# Patient Record
Sex: Female | Born: 1950 | Race: White | Hispanic: No | Marital: Single
Health system: Southern US, Community
[De-identification: ages and names within clinical notes are randomized; demographics above are authoritative.]

---

## 2020-12-13 ENCOUNTER — Emergency Department (HOSPITAL_COMMUNITY): Payer: 59

## 2020-12-13 ENCOUNTER — Other Ambulatory Visit: Payer: Self-pay

## 2020-12-13 ENCOUNTER — Inpatient Hospital Stay (HOSPITAL_COMMUNITY): Payer: 59

## 2020-12-13 ENCOUNTER — Observation Stay (HOSPITAL_COMMUNITY)
Admission: EM | Admit: 2020-12-13 | Discharge: 2020-12-15 | Disposition: A | Discharge status: Home / Self Care | Payer: 59 | Attending: Internal Medicine | Admitting: Internal Medicine

## 2020-12-13 DIAGNOSIS — J45909 Unspecified asthma, uncomplicated: Secondary | ICD-10-CM | POA: Diagnosis not present

## 2020-12-13 DIAGNOSIS — Z79899 Other long term (current) drug therapy: Secondary | ICD-10-CM | POA: Diagnosis not present

## 2020-12-13 DIAGNOSIS — F0390 Unspecified dementia without behavioral disturbance: Secondary | ICD-10-CM | POA: Insufficient documentation

## 2020-12-13 DIAGNOSIS — R4182 Altered mental status, unspecified: Secondary | ICD-10-CM | POA: Diagnosis not present

## 2020-12-13 DIAGNOSIS — I10 Essential (primary) hypertension: Secondary | ICD-10-CM | POA: Insufficient documentation

## 2020-12-13 DIAGNOSIS — Z20822 Contact with and (suspected) exposure to covid-19: Secondary | ICD-10-CM | POA: Insufficient documentation

## 2020-12-13 DIAGNOSIS — G459 Transient cerebral ischemic attack, unspecified: Principal | ICD-10-CM | POA: Diagnosis present

## 2020-12-13 DIAGNOSIS — R531 Weakness: Secondary | ICD-10-CM | POA: Diagnosis present

## 2020-12-13 DIAGNOSIS — R29898 Other symptoms and signs involving the musculoskeletal system: Secondary | ICD-10-CM

## 2020-12-13 LAB — COMPREHENSIVE METABOLIC PANEL
ALT: 29 U/L (ref 0–44)
AST: 26 U/L (ref 15–41)
Albumin: 3.6 g/dL (ref 3.5–5.0)
Alkaline Phosphatase: 98 U/L (ref 38–126)
Anion gap: 8 (ref 5–15)
BUN: 12 mg/dL (ref 8–23)
CO2: 29 mmol/L (ref 22–32)
Calcium: 9.6 mg/dL (ref 8.9–10.3)
Chloride: 102 mmol/L (ref 98–111)
Creatinine, Ser: 0.62 mg/dL (ref 0.44–1.00)
GFR, Estimated: >60 mL/min (ref >60)
Glucose, Bld: 105 mg/dL — ABNORMAL HIGH (ref 70–99)
Potassium: 3.9 mmol/L (ref 3.5–5.1)
Sodium: 139 mmol/L (ref 135–145)
Total Bilirubin: 0.7 mg/dL (ref 0.3–1.2)
Total Protein: 6.9 g/dL (ref 6.5–8.1)

## 2020-12-13 LAB — URINALYSIS, ROUTINE W REFLEX MICROSCOPIC
Bilirubin Urine: NEGATIVE
Glucose, UA: NEGATIVE mg/dL
Ketones, ur: NEGATIVE mg/dL
Leukocytes,Ua: NEGATIVE
Nitrite: POSITIVE — AB
Protein, ur: NEGATIVE mg/dL
Specific Gravity, Urine: <1.005 — ABNORMAL LOW (ref 1.005–1.030)
pH: 7 (ref 5.0–8.0)

## 2020-12-13 LAB — URINALYSIS, MICROSCOPIC (REFLEX): Squamous Epithelial / HPF: NONE SEEN (ref 0–5)

## 2020-12-13 LAB — TSH: TSH: 2.498 u[IU]/mL (ref 0.350–4.500)

## 2020-12-13 LAB — DIFFERENTIAL
Abs Immature Granulocytes: 0.02 10*3/uL (ref 0.00–0.07)
Basophils Absolute: 0 10*3/uL (ref 0.0–0.1)
Basophils Relative: 0 %
Eosinophils Absolute: 0.2 10*3/uL (ref 0.0–0.5)
Eosinophils Relative: 4 %
Immature Granulocytes: 0 %
Lymphocytes Relative: 14 %
Lymphs Abs: 0.9 10*3/uL (ref 0.7–4.0)
Monocytes Absolute: 0.6 10*3/uL (ref 0.1–1.0)
Monocytes Relative: 9 %
Neutro Abs: 4.6 10*3/uL (ref 1.7–7.7)
Neutrophils Relative %: 73 %

## 2020-12-13 LAB — I-STAT CHEM 8, ED
BUN: 13 mg/dL (ref 8–23)
Calcium, Ion: 1.19 mmol/L (ref 1.15–1.40)
Chloride: 104 mmol/L (ref 98–111)
Creatinine, Ser: 0.6 mg/dL (ref 0.44–1.00)
Glucose, Bld: 102 mg/dL — ABNORMAL HIGH (ref 70–99)
HCT: 36 % (ref 36.0–46.0)
Hemoglobin: 12.2 g/dL (ref 12.0–15.0)
Potassium: 4 mmol/L (ref 3.5–5.1)
Sodium: 141 mmol/L (ref 135–145)
TCO2: 30 mmol/L (ref 22–32)

## 2020-12-13 LAB — LIPID PANEL
Cholesterol: 180 mg/dL (ref 0–200)
HDL: 69 mg/dL (ref >40)
LDL Cholesterol: 104 mg/dL — ABNORMAL HIGH (ref 0–99)
Total CHOL/HDL Ratio: 2.6 RATIO
Triglycerides: 34 mg/dL (ref <150)
VLDL: 7 mg/dL (ref 0–40)

## 2020-12-13 LAB — CBC
HCT: 39.9 % (ref 36.0–46.0)
Hemoglobin: 12.7 g/dL (ref 12.0–15.0)
MCH: 31.5 pg (ref 26.0–34.0)
MCHC: 31.8 g/dL (ref 30.0–36.0)
MCV: 99 fL (ref 80.0–100.0)
Platelets: 243 10*3/uL (ref 150–400)
RBC: 4.03 MIL/uL (ref 3.87–5.11)
RDW: 12.2 % (ref 11.5–15.5)
WBC: 6.3 10*3/uL (ref 4.0–10.5)
nRBC: 0 % (ref 0.0–0.2)

## 2020-12-13 LAB — PROTIME-INR
INR: 1 (ref 0.8–1.2)
Prothrombin Time: 13.1 seconds (ref 11.4–15.2)

## 2020-12-13 LAB — RESP PANEL BY RT-PCR (FLU A&B, COVID) ARPGX2
Influenza A by PCR: NEGATIVE
Influenza B by PCR: NEGATIVE
SARS Coronavirus 2 by RT PCR: NEGATIVE

## 2020-12-13 LAB — PHOSPHORUS: Phosphorus: 3.8 mg/dL (ref 2.5–4.6)

## 2020-12-13 LAB — CBG MONITORING, ED: Glucose-Capillary: 93 mg/dL (ref 70–99)

## 2020-12-13 LAB — MAGNESIUM: Magnesium: 2.1 mg/dL (ref 1.7–2.4)

## 2020-12-13 LAB — APTT: aPTT: 22 seconds — ABNORMAL LOW (ref 24–36)

## 2020-12-13 IMAGING — CT CT HEAD CODE STROKE
4 series · 16 of 47 positions shown, 18 images · non-contrast
Comparison: No pertinent prior exams available for comparison.

CLINICAL DATA: Code stroke. Neuro deficit, acute, stroke suspected.
Additional history provided: Left-sided facial droop, left-sided
weakness.

EXAM:
CT HEAD WITHOUT CONTRAST
TECHNIQUE: Contiguous axial images were obtained from the base of the skull
through the vertex without intravenous contrast.

[Series 3: head wo · axial · 0.43mm/px · z∈[+68,+188]mm · 7 of 33 slices shown, 9 images]
[im 5/33  brain]
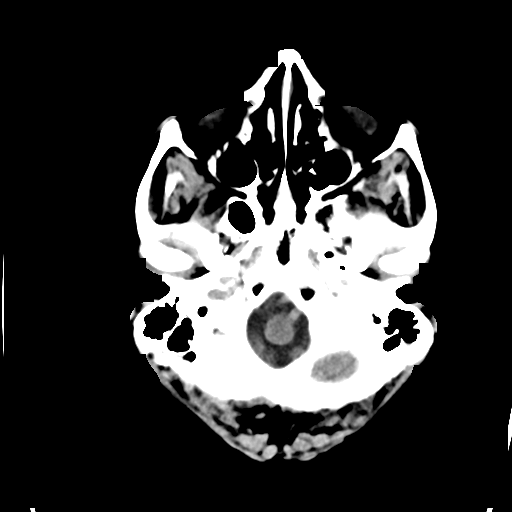
[im 5/33  bone]
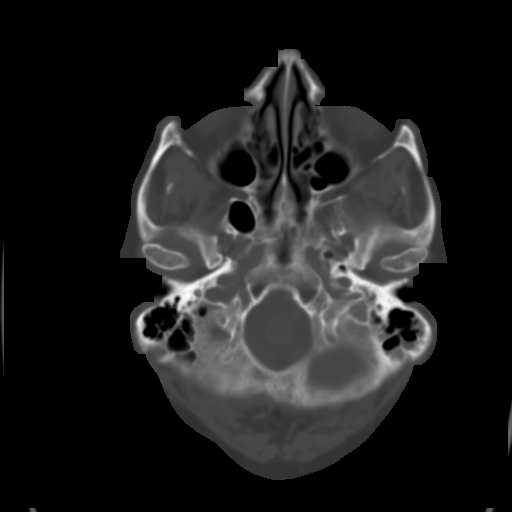
[im 9/33  brain]
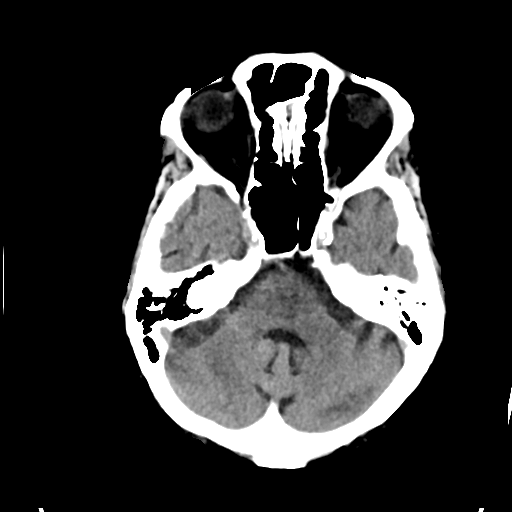
[im 13/33  brain]
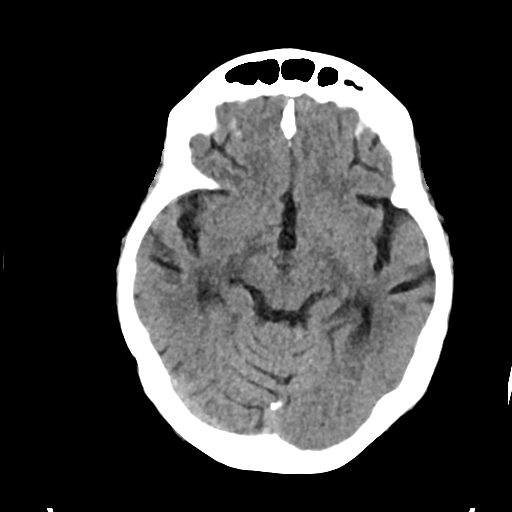
[im 17/33  brain]
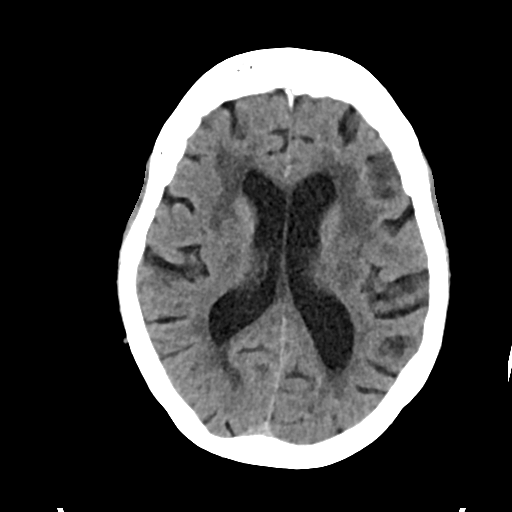
[im 21/33  brain]
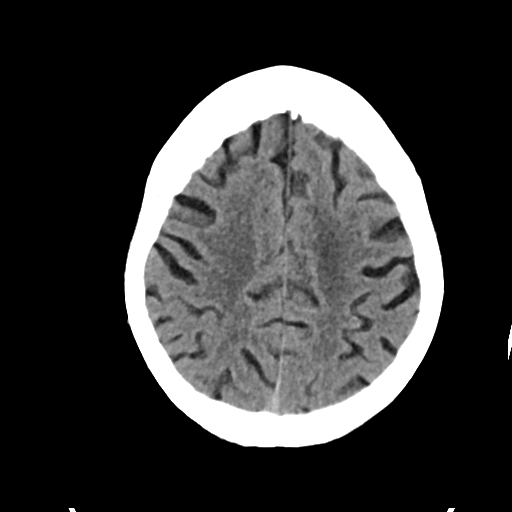
[im 21/33  bone]
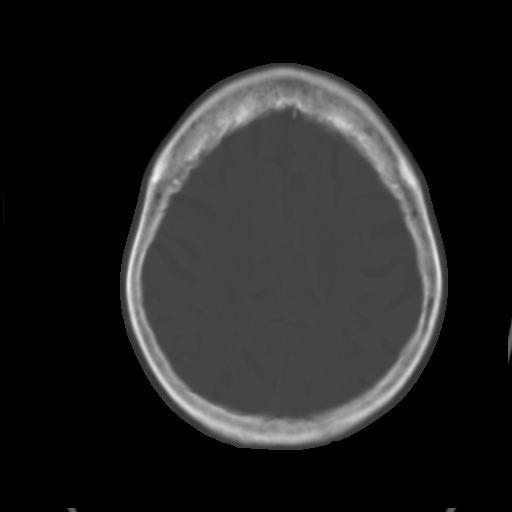
[im 25/33  brain]
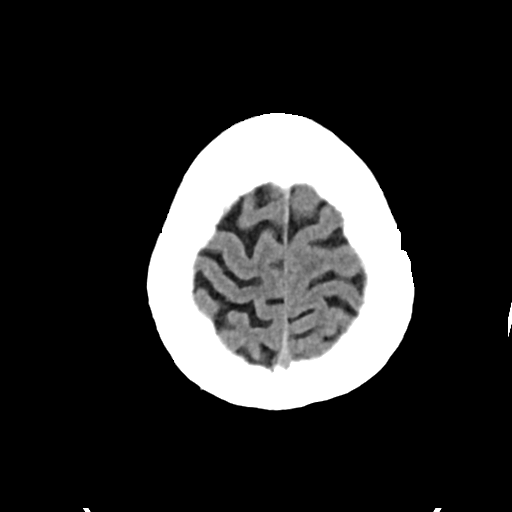
[im 29/33  brain]
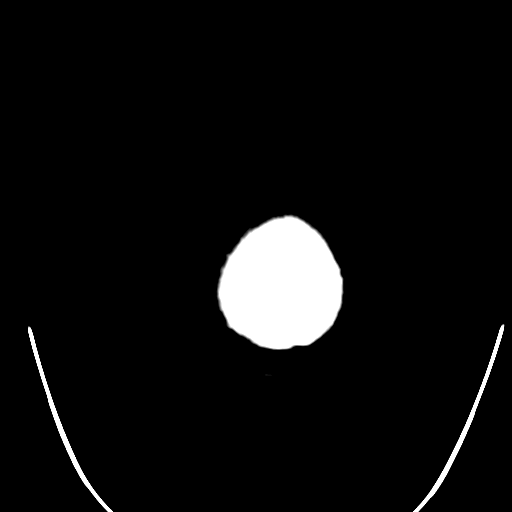

[Series 4: head bone · axial · 0.43mm/px · z∈[+64,+96]mm · 3 of 81 slices shown]
[im 9/81  bone]
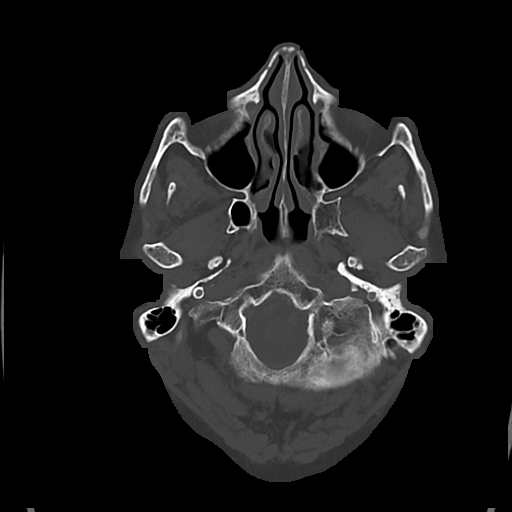
[im 17/81  bone]
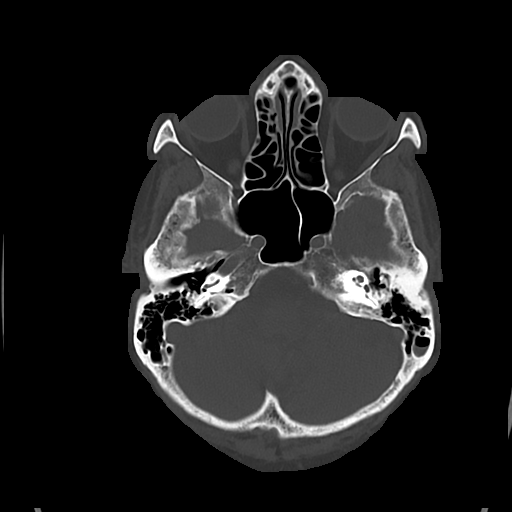
[im 25/81  bone]
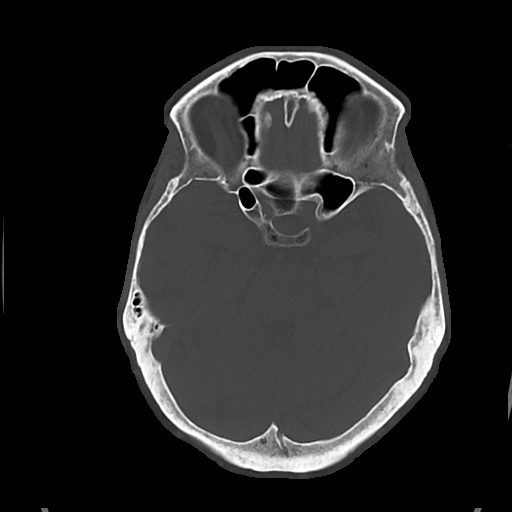

[Series 5: cor soft · coronal · 0.30mm/px · 3 of 70 slices shown]
[im 24/70  brain]
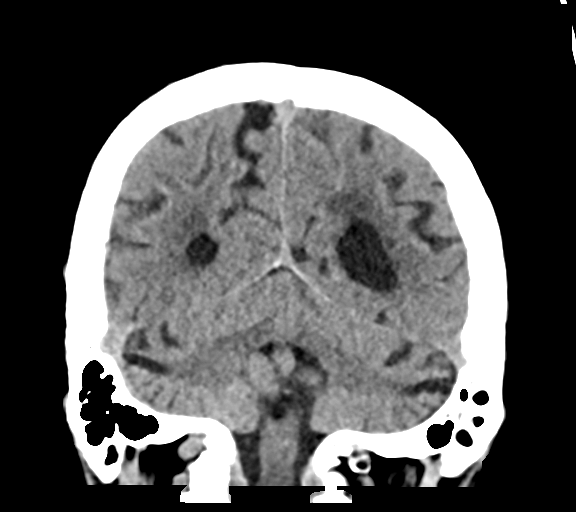
[im 31/70  brain]
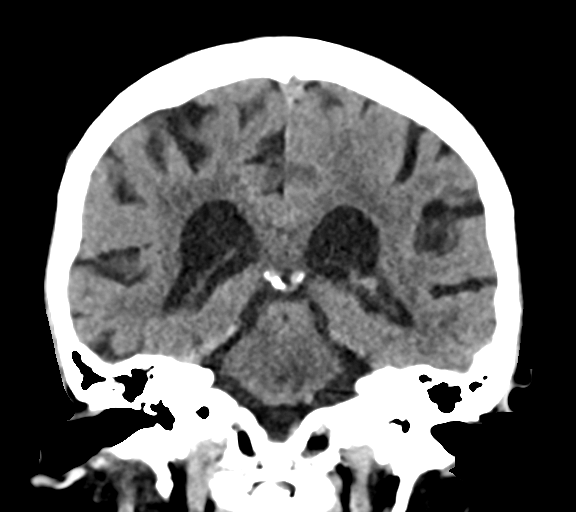
[im 39/70  brain]
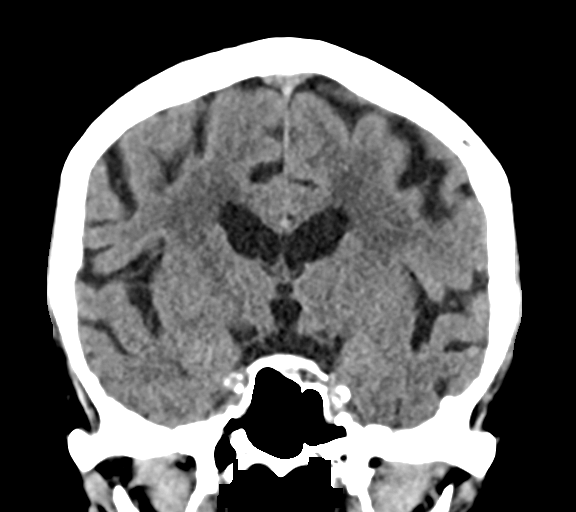

[Series 6: sag soft · sagittal · 0.32mm/px · 3 of 56 slices shown]
[im 19/56  brain]
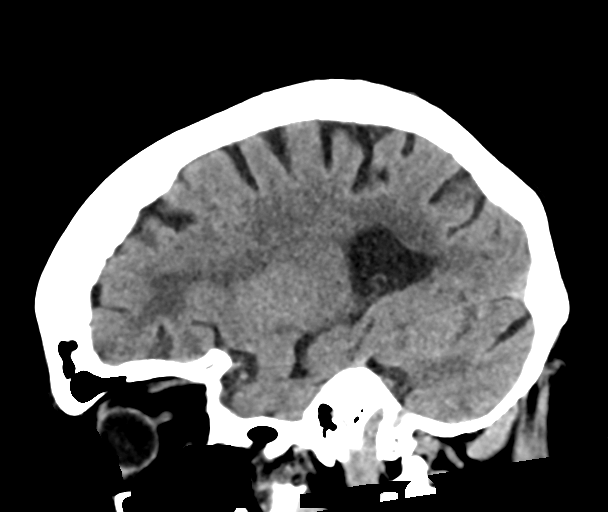
[im 28/56  brain]
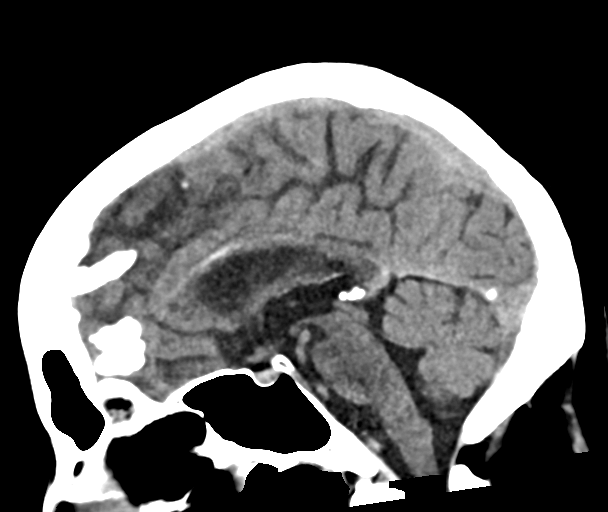
[im 37/56  brain]
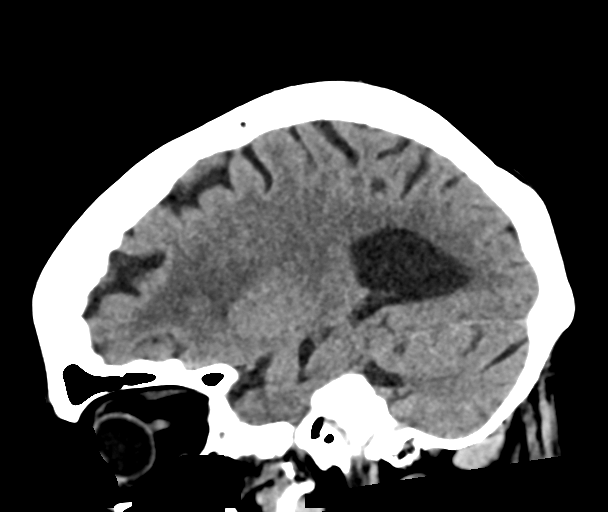

[16 of 47 positions shown; findings below may reference images not displayed]

FINDINGS: Brain:

Moderate cerebral and cerebellar atrophy.

Moderate/advanced patchy and confluent hypoattenuation within the
cerebral white matter is nonspecific, but compatible with chronic
small vessel ischemic disease.

Chronic appearing lacunar infarct within the right cerebellar
hemisphere.

There is no acute intracranial hemorrhage.

No demarcated cortical infarct.

No extra-axial fluid collection.

No evidence of intracranial mass.

No midline shift.

Vascular: No hyperdense vessel.  Atherosclerotic calcifications.

Skull: Normal. Negative for fracture or focal lesion.

Sinuses/Orbits: Visualized orbits show no acute finding. No
significant paranasal sinus disease at the imaged levels.

Other: Left mastoid effusion

ASPECTS (Alberta Stroke Program Early CT Score)

- Ganglionic level infarction (caudate, lentiform nuclei, internal
capsule, insula, M1-M3 cortex): 7

- Supraganglionic infarction (M4-M6 cortex): 3

Total score (0-10 with 10 being normal): 10

These results were communicated to Dr. FAWADULLAH at [DATE] pmon
[DATE]by text page via the [employer] messaging system.
IMPRESSION: No evidence of acute intracranial abnormality.  ASPECTS is 10.

Moderate generalized parenchymal atrophy.

Moderate/advanced cerebral white matter chronic small vessel
ischemic disease.

Chronic appearing lacunar infarct within the right cerebellar
hemisphere.

Left mastoid effusion.

## 2020-12-13 IMAGING — MR MR MRA HEAD W/O CM
10 of 12 series · 32 of 48 positions shown · non-contrast
Comparison: No pertinent prior exam.
COMPARISON: No pertinent prior exam.

CLINICAL DATA: Stroke follow-up.  Lethargy

EXAM:
MRI HEAD WITHOUT CONTRAST
MRA HEAD WITHOUT CONTRAST
TECHNIQUE: Multiplanar, multi-echo pulse sequences of the brain and surrounding
structures were acquired without intravenous contrast. Angiographic
images of the Circle of Willis were acquired using MRA technique
without intravenous contrast.

[Series 5: DWI · axial · 3.0mm · 0.88mm/px · z∈[-92,+44]mm · 7 of 96 slices shown (1 of 4)]
[im 1/96]
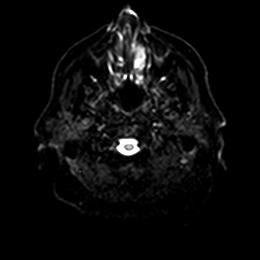
[im 16/96]
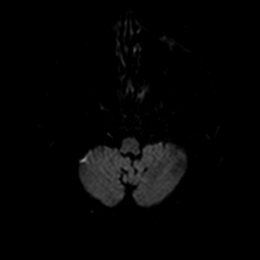
[im 32/96]
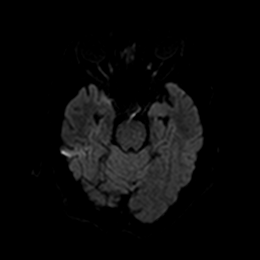
[im 48/96]
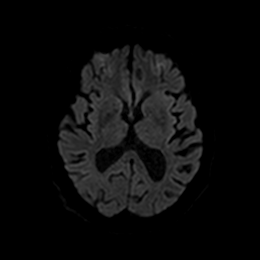
[im 64/96]
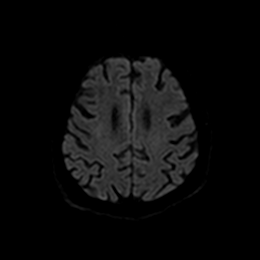
[im 80/96]
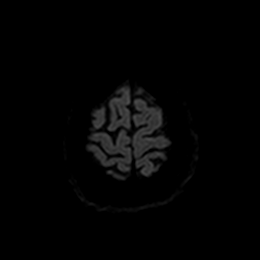
[im 96/96]
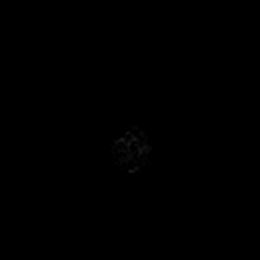

[Series 6: DWI · axial · 3.0mm · 0.88mm/px · z∈[-92,+44]mm · 3 of 48 slices shown (2 of 4)]
[im 1/48]
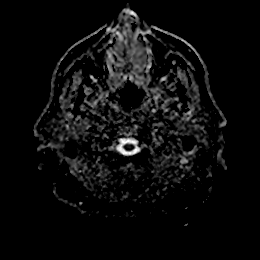
[im 24/48]
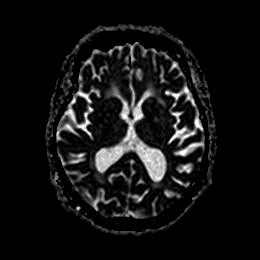
[im 48/48]
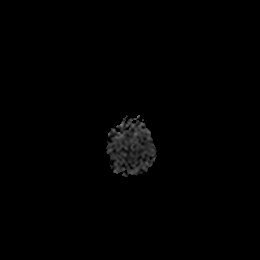

[Series 7: DWI · coronal · 4.0mm · 0.88mm/px · 4 of 64 slices shown (3 of 4)]
[im 1/64]
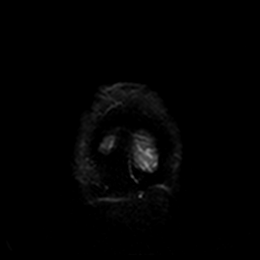
[im 22/64]
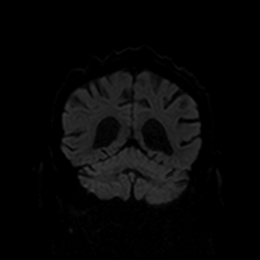
[im 43/64]
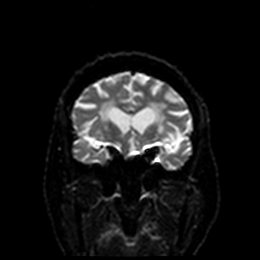
[im 64/64]
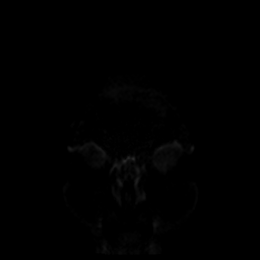

[Series 8: DWI · coronal · 4.0mm · 0.88mm/px · 2 of 32 slices shown (4 of 4)]
[im 1/32]
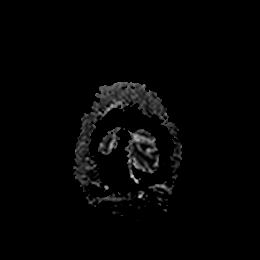
[im 32/32]
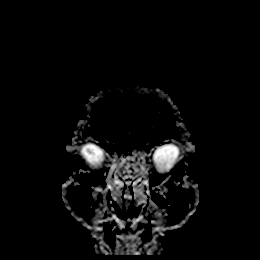

[Series 13: T1 · sagittal · 5.0mm · 0.75mm/px · 2 of 23 slices shown]
[im 1/23]
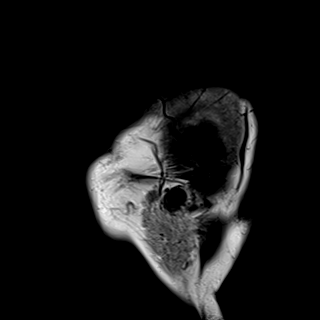
[im 23/23]
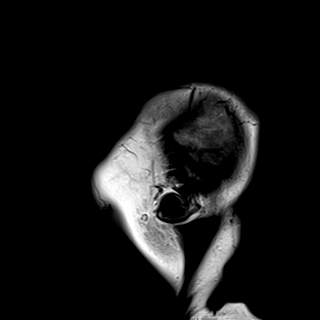

[Series 14: T2 · axial · 5.0mm · 0.72mm/px · z∈[-92,+46]mm · 2 of 25 slices shown (1 of 2)]
[im 1/25]
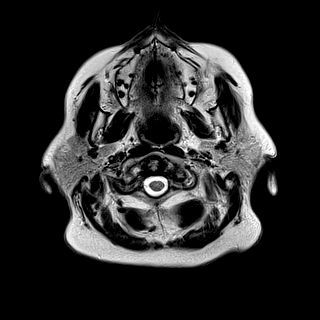
[im 25/25]
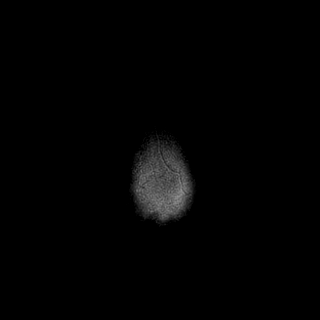

[Series 15: FLAIR · axial · 5.0mm · 0.45mm/px · z∈[-91,+47]mm · 2 of 25 slices shown]
[im 1/25]
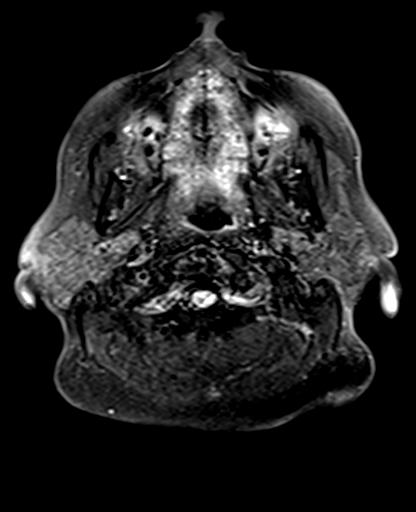
[im 25/25]
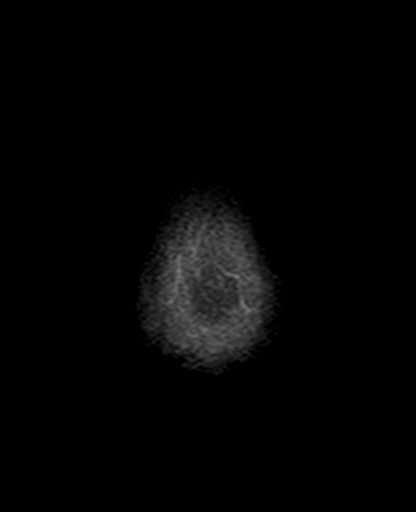

[Series 17: pha_images · axial · 3.0mm · 0.90mm/px · z∈[-90,+70]mm · 4 of 56 slices shown]
[im 1/56]
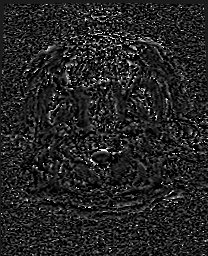
[im 19/56]
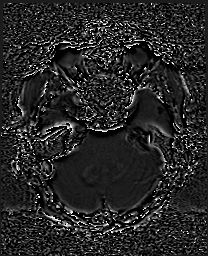
[im 37/56]
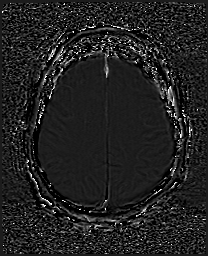
[im 56/56]
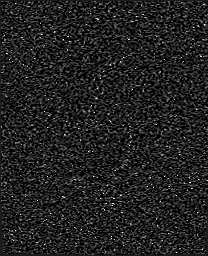

[Series 18: swi_images · axial · 3.0mm · 0.90mm/px · z∈[-96,+70]mm · 4 of 60 slices shown]
[im 1/60]
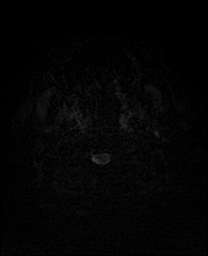
[im 20/60]
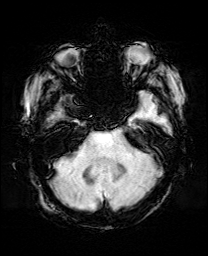
[im 40/60]
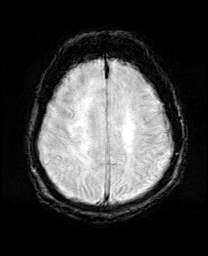
[im 60/60]
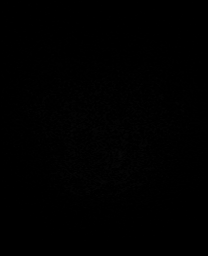

[Series 21: T2 · coronal · 5.0mm · 0.34mm/px · 2 of 29 slices shown (2 of 2)]
[im 1/29]
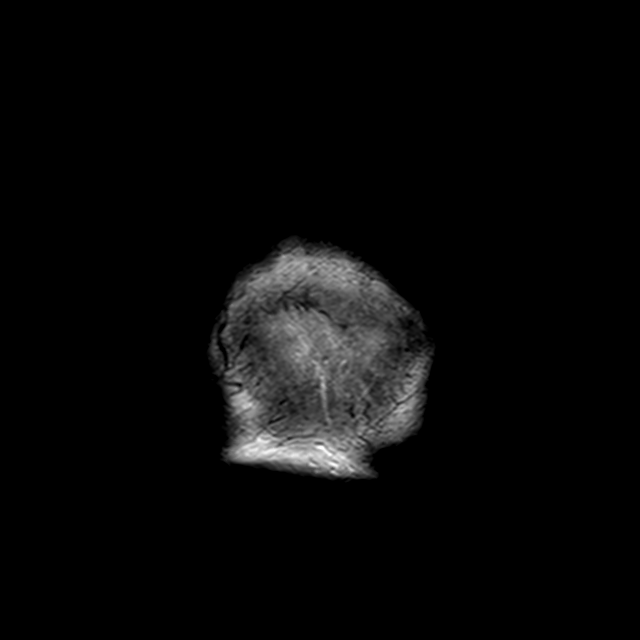
[im 29/29]
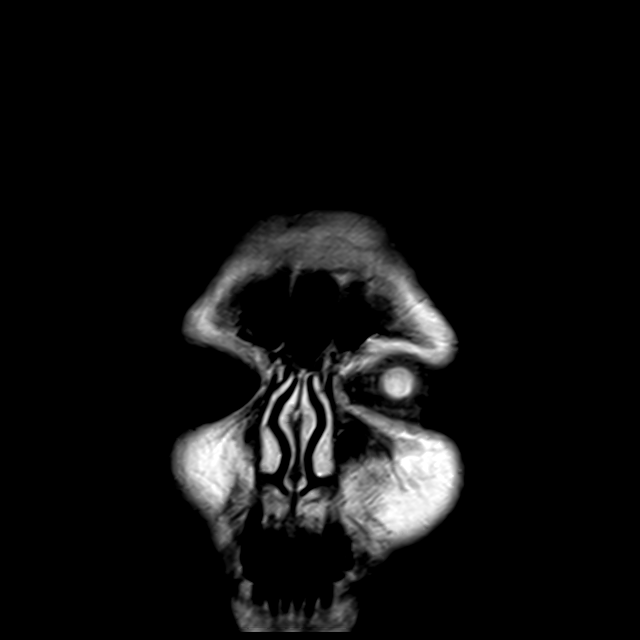

[32 of 48 positions shown; findings below may reference images not displayed]

FINDINGS: MRI HEAD FINDINGS

Brain: No acute infarct, mass effect or extra-axial collection. No
acute or chronic hemorrhage. Hyperintense T2-weighted signal is
moderately widespread throughout the white matter. Generalized
volume loss without a clear lobar predilection. The midline
structures are normal.

Vascular: Major flow voids are preserved.

Skull and upper cervical spine: Normal calvarium and skull base.
Visualized upper cervical spine and soft tissues are normal.

Sinuses/Orbits:No paranasal sinus fluid levels or advanced mucosal
thickening. No mastoid or middle ear effusion. Normal orbits.

MRA HEAD FINDINGS

POSTERIOR CIRCULATION:

--Vertebral arteries: Normal

--Inferior cerebellar arteries: Normal.

--Basilar artery: Normal.

--Superior cerebellar arteries: Normal.

--Posterior cerebral arteries: Normal.

ANTERIOR CIRCULATION:

--Intracranial internal carotid arteries: Normal.

--Anterior cerebral arteries (ACA): Normal.

--Middle cerebral arteries (MCA): Normal.

ANATOMIC VARIANTS: None
IMPRESSION: 1. No acute intracranial abnormality.
2. Moderate chronic small vessel ischemic changes.
3. Normal intracranial MRA.

## 2020-12-13 IMAGING — MR MR HEAD W/O CM
10 of 12 series · 32 of 48 positions shown · non-contrast
Comparison: No pertinent prior exam.
COMPARISON: No pertinent prior exam.

CLINICAL DATA: Stroke follow-up.  Lethargy

EXAM:
MRI HEAD WITHOUT CONTRAST
MRA HEAD WITHOUT CONTRAST
TECHNIQUE: Multiplanar, multi-echo pulse sequences of the brain and surrounding
structures were acquired without intravenous contrast. Angiographic
images of the Circle of Willis were acquired using MRA technique
without intravenous contrast.

[Series 5: DWI · axial · 3.0mm · 0.88mm/px · z∈[-92,+44]mm · 7 of 96 slices shown (1 of 4)]
[im 1/96]
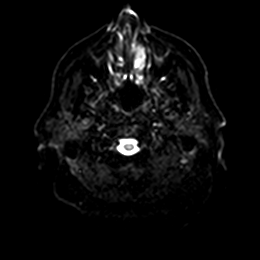
[im 16/96]
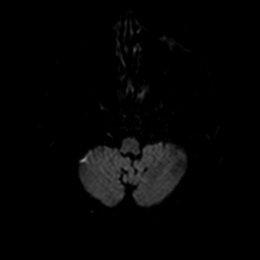
[im 32/96]
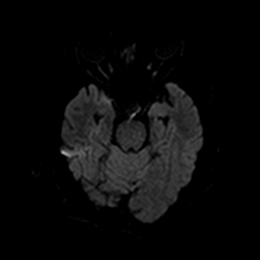
[im 48/96]
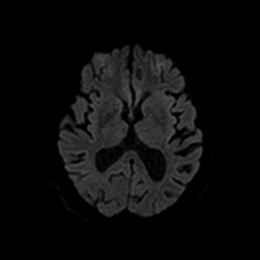
[im 64/96]
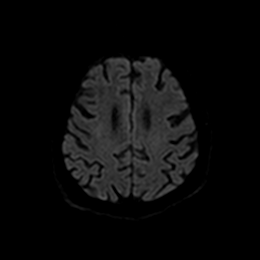
[im 80/96]
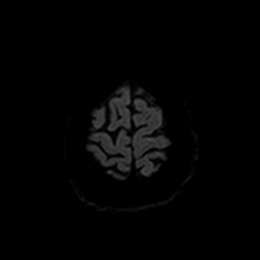
[im 96/96]
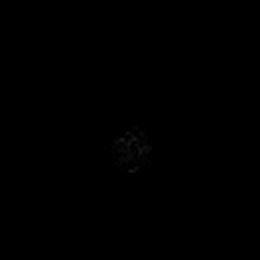

[Series 6: DWI · axial · 3.0mm · 0.88mm/px · z∈[-92,+44]mm · 3 of 48 slices shown (2 of 4)]
[im 1/48]
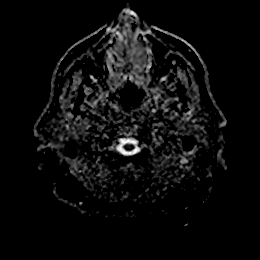
[im 24/48]
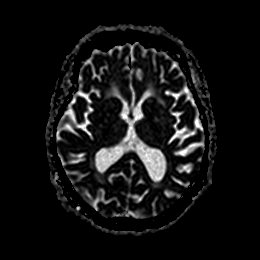
[im 48/48]
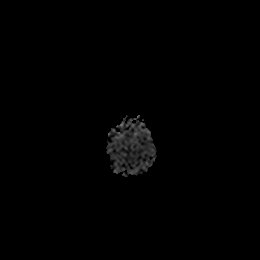

[Series 7: DWI · coronal · 4.0mm · 0.88mm/px · 4 of 64 slices shown (3 of 4)]
[im 1/64]
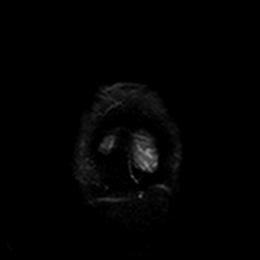
[im 22/64]
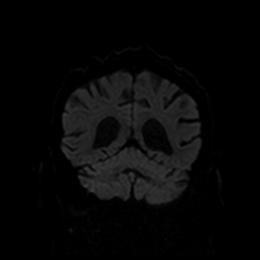
[im 43/64]
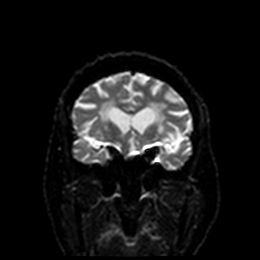
[im 64/64]
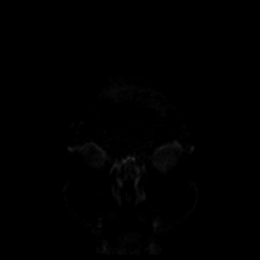

[Series 8: DWI · coronal · 4.0mm · 0.88mm/px · 2 of 32 slices shown (4 of 4)]
[im 1/32]
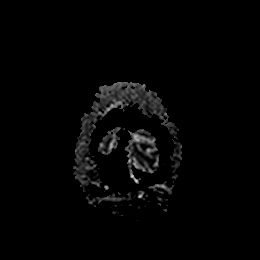
[im 32/32]
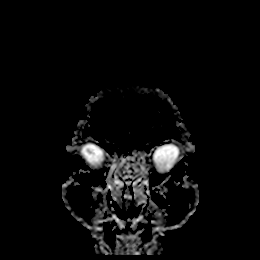

[Series 13: T1 · sagittal · 5.0mm · 0.75mm/px · 2 of 23 slices shown]
[im 1/23]
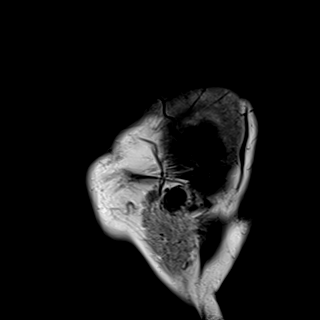
[im 23/23]
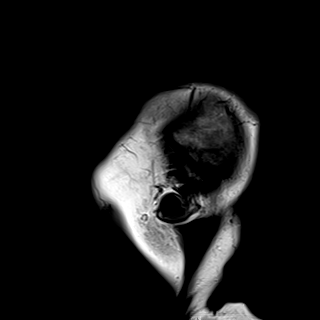

[Series 14: T2 · axial · 5.0mm · 0.72mm/px · z∈[-92,+46]mm · 2 of 25 slices shown (1 of 2)]
[im 1/25]
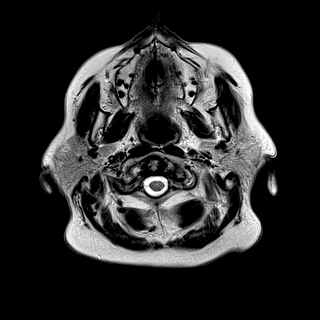
[im 25/25]
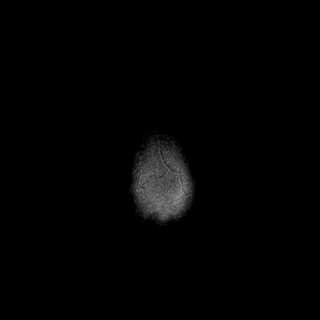

[Series 15: FLAIR · axial · 5.0mm · 0.45mm/px · z∈[-91,+47]mm · 2 of 25 slices shown]
[im 1/25]
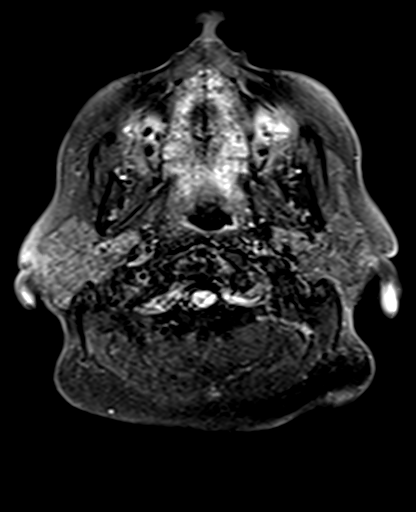
[im 25/25]
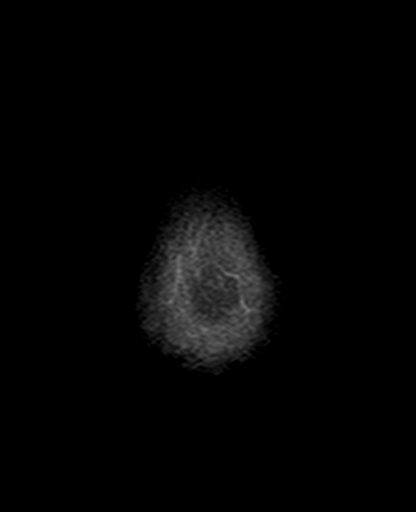

[Series 17: pha_images · axial · 3.0mm · 0.90mm/px · z∈[-90,+70]mm · 4 of 56 slices shown]
[im 1/56]
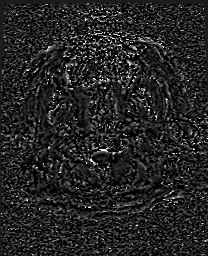
[im 19/56]
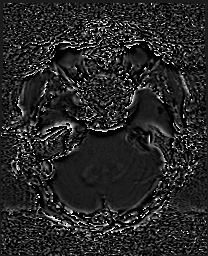
[im 37/56]
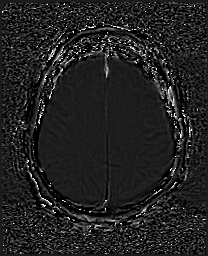
[im 56/56]
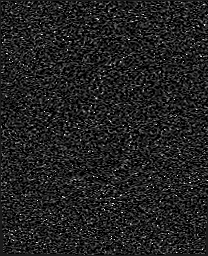

[Series 18: swi_images · axial · 3.0mm · 0.90mm/px · z∈[-96,+70]mm · 4 of 60 slices shown]
[im 1/60]
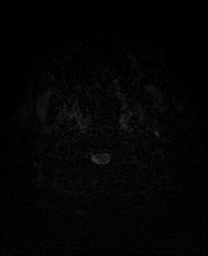
[im 20/60]
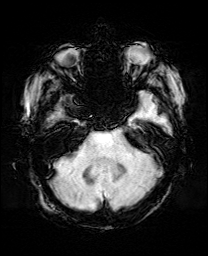
[im 40/60]
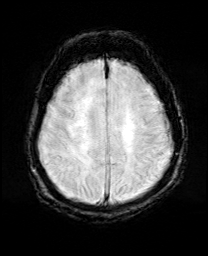
[im 60/60]
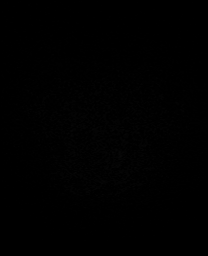

[Series 21: T2 · coronal · 5.0mm · 0.34mm/px · 2 of 29 slices shown (2 of 2)]
[im 1/29]
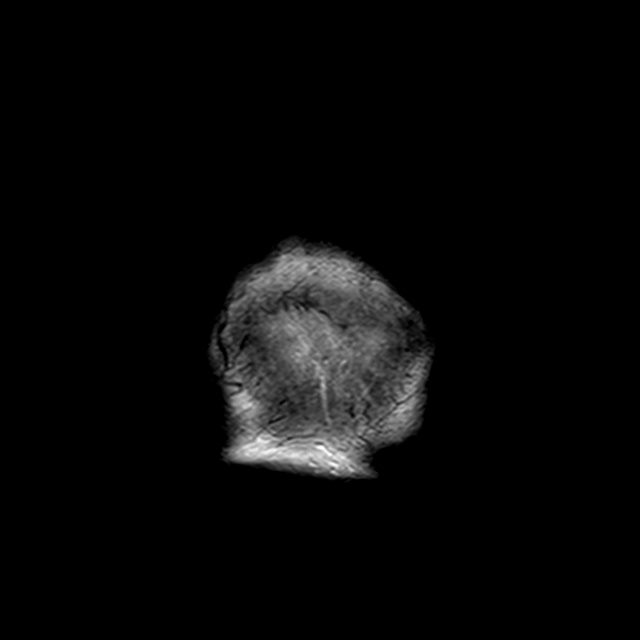
[im 29/29]
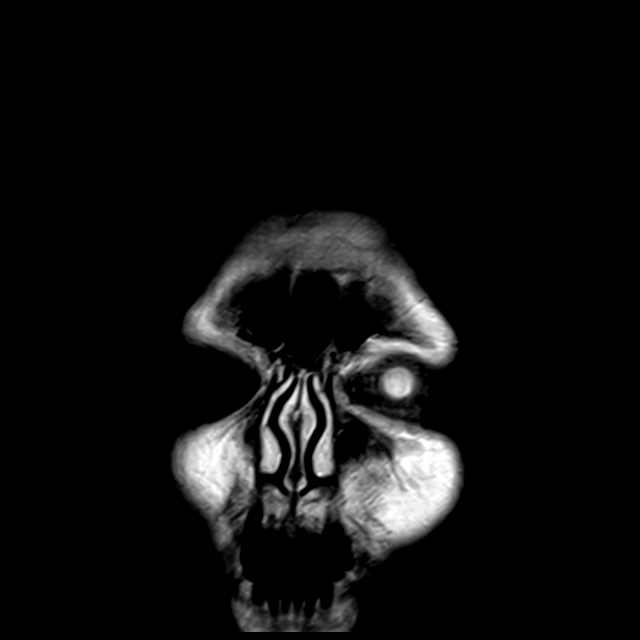

[32 of 48 positions shown; findings below may reference images not displayed]

FINDINGS: MRI HEAD FINDINGS

Brain: No acute infarct, mass effect or extra-axial collection. No
acute or chronic hemorrhage. Hyperintense T2-weighted signal is
moderately widespread throughout the white matter. Generalized
volume loss without a clear lobar predilection. The midline
structures are normal.

Vascular: Major flow voids are preserved.

Skull and upper cervical spine: Normal calvarium and skull base.
Visualized upper cervical spine and soft tissues are normal.

Sinuses/Orbits:No paranasal sinus fluid levels or advanced mucosal
thickening. No mastoid or middle ear effusion. Normal orbits.

MRA HEAD FINDINGS

POSTERIOR CIRCULATION:

--Vertebral arteries: Normal

--Inferior cerebellar arteries: Normal.

--Basilar artery: Normal.

--Superior cerebellar arteries: Normal.

--Posterior cerebral arteries: Normal.

ANTERIOR CIRCULATION:

--Intracranial internal carotid arteries: Normal.

--Anterior cerebral arteries (ACA): Normal.

--Middle cerebral arteries (MCA): Normal.

ANATOMIC VARIANTS: None
IMPRESSION: 1. No acute intracranial abnormality.
2. Moderate chronic small vessel ischemic changes.
3. Normal intracranial MRA.

## 2020-12-13 IMAGING — DX DG CHEST 1V PORT
1 series · 1 of 1 positions shown · non-contrast
Comparison: None.

CLINICAL DATA: Weakness

EXAM:
PORTABLE CHEST 1 VIEW

[chest]
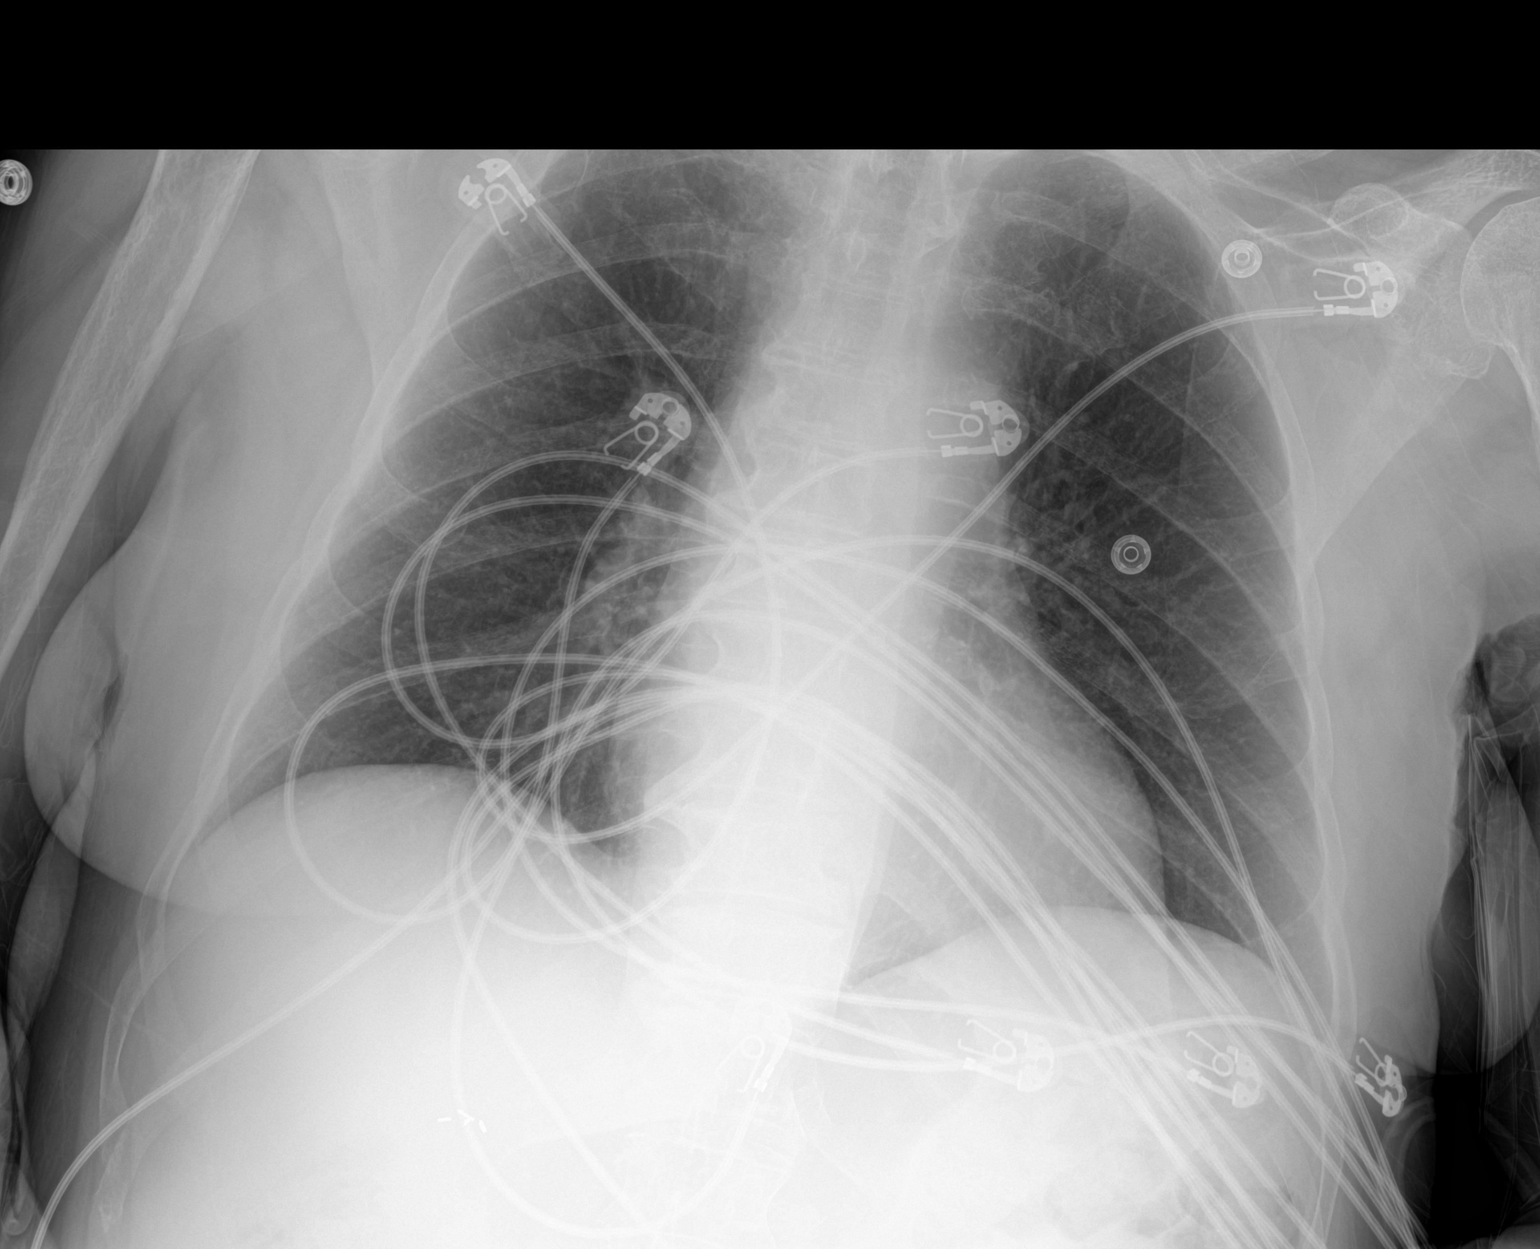

[1 of 1 positions shown; findings below may reference images not displayed]

FINDINGS: The heart size and mediastinal contours are within normal limits.
Both lungs are clear. The visualized skeletal structures are
unremarkable.
IMPRESSION: No active disease.

## 2020-12-13 MED ORDER — ENOXAPARIN SODIUM 40 MG/0.4ML IJ SOSY
40.0000 mg | PREFILLED_SYRINGE | INTRAMUSCULAR | Status: DC
  Administered 2020-12-13 – 2020-12-14 (×2): 40 mg via SUBCUTANEOUS
  Filled 2020-12-13 (×2): qty 0.4

## 2020-12-13 MED ORDER — SODIUM CHLORIDE 0.9% FLUSH
3.0000 mL | Freq: Two times a day (BID) | INTRAVENOUS | Status: DC
  Administered 2020-12-13 – 2020-12-14 (×2): 3 mL via INTRAVENOUS

## 2020-12-13 MED ORDER — ACETAMINOPHEN 325 MG PO TABS: 650.0000 mg | ORAL_TABLET | Freq: Four times a day (QID) | ORAL | Status: DC | PRN

## 2020-12-13 MED ORDER — CITALOPRAM HYDROBROMIDE 10 MG PO TABS
20.0000 mg | ORAL_TABLET | Freq: Every day | ORAL | Status: DC
  Administered 2020-12-14 – 2020-12-15 (×2): 20 mg via ORAL
  Filled 2020-12-13 (×2): qty 2

## 2020-12-13 MED ORDER — RISPERIDONE 0.5 MG PO TABS
2.0000 mg | ORAL_TABLET | Freq: Every day | ORAL | Status: DC
  Administered 2020-12-14: 2 mg via ORAL
  Filled 2020-12-13: qty 1
  Filled 2020-12-13: qty 4

## 2020-12-13 MED ORDER — ACETAMINOPHEN 650 MG RE SUPP: 650.0000 mg | Freq: Four times a day (QID) | RECTAL | Status: DC | PRN

## 2020-12-13 MED ORDER — SODIUM CHLORIDE 0.9% FLUSH
3.0000 mL | Freq: Once | INTRAVENOUS | Status: AC
  Administered 2020-12-13: 3 mL via INTRAVENOUS

## 2020-12-13 MED ORDER — ASPIRIN EC 81 MG PO TBEC
81.0000 mg | DELAYED_RELEASE_TABLET | Freq: Every day | ORAL | Status: DC
  Administered 2020-12-13 – 2020-12-15 (×3): 81 mg via ORAL
  Filled 2020-12-13 (×3): qty 1

## 2020-12-13 NOTE — ED Triage Notes (Signed)
Pt BIB [facility] from [facility] with stroke like symptoms. Staff called EMS because patient has been groggy, more lethargic then usual, being unable to get out of bed which is unlike of her. EMS observed slurred speech, L sided arm and leg weakness upon arrival. Last known normal of 1215 p.m. Upon arrival patient very drowsy, with a slight left facial droop noted. Pt Aox3 with mild slurring of speech. Pt VSS.

## 2020-12-13 NOTE — ED Provider Notes (Signed)
[facility] EMERGENCY DEPARTMENT Provider Note   CSN: [id number] Arrival date & time: 12/13/20  1718     History Chief Complaint  Patient presents with   Code Stroke    Denise Riley is a 70 y.o. female.  HPI  Level 5 caveat secondary to dementia 70 year old female with history of dementia, paranoid personality disorder, presents today with report of code stroke.  Last known normal reported at 1215.  Staff reported that she normally can get up and get around and was not able to get up as per usual.  Reported that she had left-sided weakness and was crankier than usual. Patient presented as code stroke although no focal abnormalities and went straight to the CT scanner. No past medical history on file.  There are no problems to display for this patient.   OB History   No obstetric history on file.     No family history on file.     Home Medications Prior to Admission medications   Not on File    Allergies    Patient has no allergy information on record.  Review of Systems   Review of Systems  All other systems reviewed and are negative.  Physical Exam Updated Vital Signs BP (!) 158/70 (BP Location: Right Arm)   Pulse 80   Temp (!) 97.3 F (36.3 C) (Oral)   Resp 20   Wt 69.8 kg   SpO2 96%   Physical Exam Vitals reviewed.  Constitutional:      General: She is not in acute distress. HENT:     Head: Normocephalic.     Right Ear: External ear normal.     Left Ear: External ear normal.     Nose: Nose normal.     Mouth/Throat:     Mouth: Mucous membranes are dry.  Eyes:     Extraocular Movements: Extraocular movements intact.     Pupils: Pupils are equal, round, and reactive to light.  Cardiovascular:     Rate and Rhythm: Normal rate and regular rhythm.     Pulses: Normal pulses.  Pulmonary:     Effort: Pulmonary effort is normal.  Abdominal:     General: Abdomen is flat.     Palpations: Abdomen is soft.  Musculoskeletal:         General: Normal range of motion.     Cervical back: Normal range of motion.  Skin:    General: Skin is warm.     Capillary Refill: Capillary refill takes less than 2 seconds.  Neurological:     General: No focal deficit present.     Mental Status: She is alert and oriented to person, place, and time.     Cranial Nerves: No cranial nerve deficit.     Motor: No weakness.  Psychiatric:        Mood and Affect: Mood normal.    ED Results / Procedures / Treatments   Labs (all labs ordered are listed, but only abnormal results are displayed) Labs Reviewed  APTT - Abnormal; Notable for the following components:      Result Value   aPTT 22 (*)    All other components within normal limits  I-STAT CHEM 8, ED - Abnormal; Notable for the following components:   Glucose, Bld 102 (*)    All other components within normal limits  PROTIME-INR  CBC  DIFFERENTIAL  COMPREHENSIVE METABOLIC PANEL  CBG MONITORING, ED  CBG MONITORING, ED    EKG EKG Interpretation  Date/Time:  Sunday December 13 2020 17:52:28 EDT Ventricular Rate:  69 PR Interval:  171 QRS Duration: 101 QT Interval:  442 QTC Calculation: 474 R Axis:   0 Text Interpretation: Sinus rhythm Probable left atrial enlargement Abnormal R-wave progression, early transition Confirmed by Micharl Helmes ([id number]) on 12/13/2020 7:59:48 PM  Radiology CT HEAD CODE STROKE WO CONTRAST  Result Date: 12/13/2020 CLINICAL DATA:  Code stroke. Neuro deficit, acute, stroke suspected. Additional history provided: Left-sided facial droop, left-sided weakness. EXAM: CT HEAD WITHOUT CONTRAST TECHNIQUE: Contiguous axial images were obtained from the base of the skull through the vertex without intravenous contrast. COMPARISON:  No pertinent prior exams available for comparison. FINDINGS: Brain: Moderate cerebral and cerebellar atrophy. Moderate/advanced patchy and confluent hypoattenuation within the cerebral white matter is nonspecific, but compatible with  chronic small vessel ischemic disease. Chronic appearing lacunar infarct within the right cerebellar hemisphere. There is no acute intracranial hemorrhage. No demarcated cortical infarct. No extra-axial fluid collection. No evidence of intracranial mass. No midline shift. Vascular: No hyperdense vessel.  Atherosclerotic calcifications. Skull: Normal. Negative for fracture or focal lesion. Sinuses/Orbits: Visualized orbits show no acute finding. No significant paranasal sinus disease at the imaged levels. Other: Left mastoid effusion ASPECTS (Alberta Stroke Program Early CT Score) - Ganglionic level infarction (caudate, lentiform nuclei, internal capsule, insula, M1-M3 cortex): 7 - Supraganglionic infarction (M4-M6 cortex): 3 Total score (0-10 with 10 being normal): 10 These results were communicated to Dr. Lindzen at 5:38 pmon 6/19/2022by text page via the AMION messaging system. IMPRESSION: No evidence of acute intracranial abnormality.  ASPECTS is 10. Moderate generalized parenchymal atrophy. Moderate/advanced cerebral white matter chronic small vessel ischemic disease. Chronic appearing lacunar infarct within the right cerebellar hemisphere. Left mastoid effusion. Electronically Signed   By: Kyle  Golden DO   On: 12/13/2020 17:38    Procedures Procedures   Medications Ordered in ED Medications  sodium chloride flush (NS) 0.9 % injection 3 mL (has no administration in time range)    ED Course  I have reviewed the triage vital signs and the nursing notes.  Pertinent labs & imaging results that were available during my care of the patient were reviewed by me and considered in my medical decision making (see chart for details).    MDM Rules/Calculators/A&P                          70  year old female with report of being groggy than usual, generalized weakness and some reported left sided weakness.  Here she has not had any lateralized symptoms.  She was seen and evaluated by neurology as a code  stroke upon arrival. Seen and evaluated here with CT, labs.  She was not given tPA due to being outside of the thrombolytic time window and IR not appropriate as not consistent with LVO.  Differential diagnosis including unwitnessed seizure with postictal state, TIA, and sedation due to medications or small part identified on CT.  Advised admission for MRI, cardiac telemetry, carotid ultrasound, TTE, fasting lipids, prophylactic therapy with aspirin, PT OT speech therapy, n.p.o. until passing swallow screen Discussed with IM teaching service and plan to see for admission.  Final Clinical Impression(s) / ED Diagnoses Final diagnoses:  Left arm weakness    Rx / DC Orders ED Discharge Orders     None        , MD 12/13/20 2009

## 2020-12-13 NOTE — Consult Note (Signed)
Neurology Consultation  Reason for Consult: Left arm weakness, dysarthria  Referring Physician: Dr. Rosalia Hammers  CC: :"I couldn't get out of bed because my left arm was weak"  History is obtained from: EMS, Patient, SNF records   HPI: Denise Riley is a 70 y.o. female with a medical history significant for vascular dementia with behavior disturbance, paranoid personality disorder, frequent falls, essential hypertension, hyperlipidemia, vitamin D deficiency, anemia, and major depressive disorder who was brought in by EMS from her skilled nursing facility after she was found to be lethargic and weak on her left arm. Patient states that she noticed that she could not get out of bed due to left arm weakness at around 12:15 PM but did not note any weakness in her left lower extremity. She also complained of pain during EMS evaluation of left arm strength, possibly affecting her left arm function.   At baseline, SNF notes that Denise Riley is normally awake and communicative. She frequently paces the halls of her SNF without assistive device. She does have vascular dementia at baseline with unspecified cognitive deficits. She is dependent upon staff for her ADLs.   LKW: 12:15 pm tpa given?: no, outside of time window IR Thrombectomy? No, presentation not consistent with LVO Modified Rankin Scale: 3-Moderate disability-requires help but walks WITHOUT assistance  ROS: A complete ROS was performed and is negative except as noted in the HPI.   No past medical history on file. Vascular dementia with behavior disturbance Paranoid personality disorder Frequent falls Essential hypertension Hyperlipidemia Vitamin D deficiency Major depressive disorder  No family history on file.  Social History:   has no history on file for tobacco use, alcohol use, and drug use.  Medications Meds at SNF not currently available in Epic   Current Facility-Administered Medications:    sodium chloride flush (NS) 0.9 %  injection 3 mL, 3 mL, Intravenous, Once, Margarita Grizzle, MD No current outpatient medications on file.  Exam: Current vital signs: Wt 69.8 kg  Vital signs in last 24 hours: Weight:  [69.8 kg] 69.8 kg (06/19 1700)  GENERAL: Drowsy, laying in EMS stretcher, in no acute distress Head: Normocephalic and atraumatic EENT: Normal conjunctivae, no OP obstruction LUNGS: Normal respiratory effort. Non-labored breathing CV: RRR on tele ABDOMEN - Soft, non-tender Ext: warm, well perfused, without obvious abnormality  NEURO:  Mental Status: Drowsy but wakes easily to voice, alert, and oriented to person, place, year, month, state. She does incorrectly state the city initially. She is able to provide a clear description of present events in the context of short and poorly detailed answers to questions.  Concentration is fair - she is able to name the months of the year from January to August but then loses track. She has a subtle dysarthria and is not aphasic. Naming, repetition, fluency, and comprehension intact. No neglect is noted.  Cranial Nerves:  II: PERRL. Visual fields full.  III, IV, VI: EOMI without ptosis V: Sensation is intact to light touch and symmetrical to face.  VII: Face is subtly asymmetric with smiling with slightly less elevation of left mouth.  VIII: Hearing is intact to voice IX, X: Palate elevation is symmetric. Phonation normal.  XI: Normal sternocleidomastoid and trapezius muscle strength XII: Tongue protrudes midline without fasciculations.   Motor: 5/5 strength present in bilateral lower extremities and 4+/5 strength present in bilateral upper extremities. No vertical drift noted throughout extremities.  Tone is normal. Bulk is normal.  Sensation: Intact to light touch and cool temperature bilaterally  in all four extremities.  Coordination: FTN intact bilaterally. No pronator drift.  DTRs: 3+ and symmetric patellae, 3+ right biceps and brachioradialis, 2+ left bicep  and brachioradialis  Plantars: Right: upgoing Left: downgoing  Gait: Deferred  NIHSS: 1a Level of Conscious.: 1 1b LOC Questions: 0 1c LOC Commands: 0 2 Best Gaze: 0 3 Visual: 0 4 Facial Palsy: 1 5a Motor Arm - left: 0 5b Motor Arm - Right: 0 6a Motor Leg - Left: 0 6b Motor Leg - Right: 0 7 Limb Ataxia: 0 8 Sensory: 0 9 Best Language: 0 10 Dysarthria: 1 11 Extinct. and Inatten.: 0 TOTAL: 3  Labs I have reviewed labs in epic and the results pertinent to this consultation are: CBC    Component Value Date/Time   HGB 12.2 12/13/2020 1729   HCT 36.0 12/13/2020 1729   CMP     Component Value Date/Time   NA 141 12/13/2020 1729   K 4.0 12/13/2020 1729   CL 104 12/13/2020 1729   GLUCOSE 102 (H) 12/13/2020 1729   BUN 13 12/13/2020 1729   CREATININE 0.60 12/13/2020 1729   Lipid Panel  No results found for: CHOL, TRIG, HDL, CHOLHDL, VLDL, LDLCALC, LDLDIRECT No results found for: HGBA1C  Imaging I have reviewed the images obtained:  CT-scan of the brain 12/13/2020: No evidence of acute intracranial abnormality.  ASPECTS is 10. Moderate generalized parenchymal atrophy. Moderate/advanced cerebral white matter chronic small vessel ischemic disease. Chronic appearing lacunar infarct within the right cerebellar hemisphere. Left mastoid effusion.  Assessment: 70 y.o. female who presented as a Code Stroke for evaluation of left upper extremity weakness, lethargy, and dysarthria. - Examination revealed patient without objective left upper extremity weakness (initially thought weakness may be due to pain or discomfort). She does have minimal dysarthria in the context of her drowsiness. Initial NIHSS of 3. - CT imaging without acute intracranial abnormality but with moderate generalized parenchymal atrophy, moderate cerebral white matter chronic small vessel ischemic disease, and chronic lacunar right cerebellar infarct.  - Stroke risk factors include age, gender, hypertension,  hyperlipidemia, and previous stroke. - tPA not given due to patient out of thrombolytic time window, IR not appropriate because presentation is not consistent with LVO.  - DDx includes new onset unwitnessed seizure with post-ictal state, TIA, sedation due to medications or small infarct not identified on CT.   Recommendations: - MRI and MRA of brain without contrast - TTE - Cardiac telemetry - Carotid ultrasound - Fasting lipid panel, Hemoglobin A1c - Consider left arm imaging if continued complaints of pain - Prophylactic therapy: Antiplatelet: ASA 81 mg daily (if home medications do not include antiplatelet therapy) - PT/OT/Speech therapy - NPO until passed swallow screen - Modified permissive HTN protocoll given advanced age. Treat SBP if > 180.   Denise Riley, AGAC-Denise Riley [facility] Pager: [phone]  I have seen and examined the patient. I have formulated the assessment and recommendations. My exam findings were observed and documented by Denise Boast, Denise Riley.  Electronically signed: Dr. Caryl Riley

## 2020-12-13 NOTE — H&P (Signed)
Date: 12/13/2020               Patient Name:  Denise Riley MRN: [id number]  DOB: March 09, 1951 Age / Sex: 70 y.o., female   PCP: No primary care provider on file.         Medical Service: Internal Medicine Teaching Service         Attending Physician: Dr. Oswaldo Done, Marquita Palms, *    First Contact: Merrilyn Puma, MD Pager: [phone]  Second Contact: Verdene Lennert, MD Pager: IB [phone]       After Hours (After 5p/  First Contact Pager: [phone]  weekends / holidays): Second Contact Pager: [phone]   SUBJECTIVE   Chief Complaint: left arm weakness  History of Present Illness: Denise Riley is a 70 y.o. female with a pertinent PMH of vascular dementia, paranoid personality disorder, HTN, HLD, vitamin D deficiency, anemia, major depressive disorder, who presents to [facility] from [facility] with left arm weakness.  Patient was found earlier today with left arm weakness and increased confusion concerning for acute stroke and was sent to [facility] for further evaluation. On evaluation the patient awake and alert to person, time, and event. She states that she was back in [place], but only required minimal prompting to become oriented to place. She is slow to answer questions but able to answer question in short phrases without elaboration. She was unable to give much detail regarding the symptoms that brought her into [facility], but states that she was hear because nursing staff was concerned she was having "mini strokes". She did admit to some dizziness when standing today, but denies any dizziness currently. She denies any focal weakness, numbness/tingling, change in vision, dysarthria, facial droop, aphasia,  She endorses generalized weakness, and frequent urinary incontinence, but otherwise denies chest pain, shortness of breath, abdominal pain, dysuria, hematuria, constipation/diarrhea, recent changes in medications, sick contacts, recent falls.  Past Medical History Vascualr  dementia Paranoid mood disorder Asthma Gait issues/Repeated falls HTN HLD Vitamin D def Anemia Allergic rhinitis Tremors  MDD Generalized mucles weakness  Medications:  Risperidone  2mg  daily Rosuvastatin 10 mg daily  Propranolol 60 mg  Citlapram 20 mg  Vitamin D Zyrtec   Social:  Lives - Occupation - retired Chief Operating Officer - live at [employer] Level of function - Ambulates with walker, needs help with using the toilet, independent with eating and drinking PCP - unknown Substance use - none  Family History: Family history is unknown  Allergies: Penicillin  Erythromycin   Review of Systems: A complete ROS was negative except as per HPI.   OBJECTIVE:   Physical Exam: Blood pressure 121/86, pulse 80, temperature (!) 97.3 F (36.3 C), temperature source Rectal, resp. rate 12, weight 69.8 kg, SpO2 100 %. Physical Exam Constitutional:      General: She is not in acute distress.    Appearance: She is not ill-appearing.     Comments: Appears older than stated age  HENT:     Head: Normocephalic and atraumatic.     Mouth/Throat:     Mouth: Mucous membranes are moist.     Pharynx: Oropharynx is clear.  Eyes:     General: No visual field deficit.    Extraocular Movements: Extraocular movements intact.     Pupils: Pupils are equal, round, and reactive to light.  Cardiovascular:     Rate and Rhythm: Normal rate and regular rhythm.     Pulses: Normal pulses.     Heart sounds:  Normal heart sounds.  Pulmonary:     Effort: Pulmonary effort is normal.     Breath sounds: Normal breath sounds.  Abdominal:     General: Abdomen is flat. There is no distension.     Tenderness: There is no abdominal tenderness.  Musculoskeletal:        General: No swelling or tenderness. Normal range of motion.     Cervical back: Normal range of motion. No tenderness.  Skin:    General: Skin is warm and dry.  Neurological:     Mental Status: She is alert.     Cranial  Nerves: Cranial nerves are intact. No facial asymmetry.     Sensory: Sensation is intact.     Motor: Weakness (generalized weakness) and tremor (bilateral hands with movment) present.     Coordination: Finger-Nose-Finger Test abnormal.     Comments: Possible mild dysarthria but may be baseline Appears drowsy become alert easily with verbal stiuli    Labs: CBC    Component Value Date/Time   WBC 6.3 12/13/2020 1724   RBC 4.03 12/13/2020 1724   HGB 12.2 12/13/2020 1729   HCT 36.0 12/13/2020 1729   PLT 243 12/13/2020 1724   MCV 99.0 12/13/2020 1724   MCH 31.5 12/13/2020 1724   MCHC 31.8 12/13/2020 1724   RDW 12.2 12/13/2020 1724   LYMPHSABS 0.9 12/13/2020 1724   MONOABS 0.6 12/13/2020 1724   EOSABS 0.2 12/13/2020 1724   BASOSABS 0.0 12/13/2020 1724     CMP     Component Value Date/Time   NA 141 12/13/2020 1729   K 4.0 12/13/2020 1729   CL 104 12/13/2020 1729   CO2 29 12/13/2020 1724   GLUCOSE 102 (H) 12/13/2020 1729   BUN 13 12/13/2020 1729   CREATININE 0.60 12/13/2020 1729   CALCIUM 9.6 12/13/2020 1724   PROT 6.9 12/13/2020 1724   ALBUMIN 3.6 12/13/2020 1724   AST 26 12/13/2020 1724   ALT 29 12/13/2020 1724   ALKPHOS 98 12/13/2020 1724   BILITOT 0.7 12/13/2020 1724   GFRNONAA >60 12/13/2020 1724    Imaging: DG Chest Port 1 View The heart size and mediastinal contours are within normal limits. Both lungs are clear. The visualized skeletal structures are unremarkable. IMPRESSION: No active disease.   CT HEAD CODE STROKE WO CONTRAST No evidence of acute intracranial abnormality.  ASPECTS is 10. Moderate generalized parenchymal atrophy. Moderate/advanced cerebral white matter chronic small vessel ischemic disease. Chronic appearing lacunar infarct within the right cerebellar hemisphere. Left mastoid effusion.   EKG: personally reviewed my interpretation is sinus rhythm   ASSESSMENT & PLAN:   Active Problems:   TIA (transient ischemic attack)   Denise Riley is  a 70 y.o. with pertinent PMH of vascular dementia, paranoid personality disorder, HTN, HLD, vitamin D deficiency, anemia, major depressive disorder,  who presented with Left arm weakness and dysarthria that has since resolved and admit for transient ischemic attack on hospital day 0  #Transient Ischemic Attack: Patient presents for evaluation of left arm weakness, dysarthria, facial drop and dizziness. Patients symptoms appear to have resolved on presentation. Therefore, patient likely experienced a TIA. Initial CT head wo contrast showed moderate to advanced small vessel ischemic disease which is consistent with her history of vascular dementia. Further work up is pending to assess risk factors for further strokes in the future.  - Neurology consulted in the ED. Appreciate their assistance.  - MRI and MRA of brian pending - [place] of carotids pending  -  Echocardiogram pending - A1c and lipid panel pending - PT/OT/Speech therapy consulted  - Bedside stroke swallow screen pending - Will continue permissive HTN  with goal SBP >180 - Will get orthostatic to further evaluate dizziness if stroke work up is negative.  - Will start ASA 81 mg per Stroke team.  #HTN #HLD #Vascular Dementia: Patient has a history of HTN and HLD. She does not take any antihypertensive medications at home, but is on rosuvastatin 10 mg daily for HLD.  - Continue rosuvastatin 10 mg, will consider increasing to 20 mg   #Paranoid mood disorder #Major depressive disorder Patient takes Risperidone 2 mg daily and citalopram 20 mg daily. She does not appear to be acutely encephalopathic. Therefore, we will continue these medications.  - Will continue these medications tomorrow  #Essential Tremors: Patient has a history of tremors and is currently taking propranolol 60 mg daily.  - Will hold off on propranolol due to permissive HTN.   Diet:  NPO until passed stroke swallow > HH diet VTE: Enoxaparin IVF: None Code:  Full  Prior to Admission Living Arrangement: SNF Anticipated Discharge Location: SNF Barriers to Discharge: CVA work up  Dispo: Admit patient to Inpatient with expected length of stay greater than 2 midnights.  Signed: Chari Manning, D.O.  Internal Medicine Resident, PGY-2 [facility]  Pager: [phone] 10:09 PM, 12/13/2020   Please contact the on call pager after 5 pm and on weekends at [phone].

## 2020-12-13 NOTE — Hospital Course (Addendum)
From [place]  Left arm weakness early today -  No acute stroke    Baseline function - she can ambulate throughout the units with walker, some incontinence and help getting   Left are weakness, left facial numbness and some facial droop and dysarthria    PMH: - muscles wa Vascualr dementia Paranoid m Asthma Gait issues Repreated falls HTN HLD Vta def Anemia Allergic rhinitis Tremors  MDD Generalized mucles weakness  Allergic to PCN and erythromycin   Respiradoen 2mg   Rousucatatin 10 mg vit d  Zyrtec Propranolol 60 mg  Citlapram 20 mg

## 2020-12-14 ENCOUNTER — Observation Stay (HOSPITAL_BASED_OUTPATIENT_CLINIC_OR_DEPARTMENT_OTHER): Payer: 59

## 2020-12-14 ENCOUNTER — Observation Stay (HOSPITAL_COMMUNITY): Payer: 59

## 2020-12-14 DIAGNOSIS — G459 Transient cerebral ischemic attack, unspecified: Secondary | ICD-10-CM | POA: Diagnosis not present

## 2020-12-14 LAB — CBC
HCT: 38.5 % (ref 36.0–46.0)
Hemoglobin: 12.7 g/dL (ref 12.0–15.0)
MCH: 32.1 pg (ref 26.0–34.0)
MCHC: 33 g/dL (ref 30.0–36.0)
MCV: 97.2 fL (ref 80.0–100.0)
Platelets: 277 10*3/uL (ref 150–400)
RBC: 3.96 MIL/uL (ref 3.87–5.11)
RDW: 12.1 % (ref 11.5–15.5)
WBC: 7 10*3/uL (ref 4.0–10.5)
nRBC: 0 % (ref 0.0–0.2)

## 2020-12-14 LAB — TSH: TSH: 2.271 u[IU]/mL (ref 0.350–4.500)

## 2020-12-14 LAB — COMPREHENSIVE METABOLIC PANEL
ALT: 29 U/L (ref 0–44)
AST: 24 U/L (ref 15–41)
Albumin: 3.5 g/dL (ref 3.5–5.0)
Alkaline Phosphatase: 101 U/L (ref 38–126)
Anion gap: 8 (ref 5–15)
BUN: 10 mg/dL (ref 8–23)
CO2: 27 mmol/L (ref 22–32)
Calcium: 9.8 mg/dL (ref 8.9–10.3)
Chloride: 106 mmol/L (ref 98–111)
Creatinine, Ser: 0.58 mg/dL (ref 0.44–1.00)
GFR, Estimated: >60 mL/min (ref >60)
Glucose, Bld: 105 mg/dL — ABNORMAL HIGH (ref 70–99)
Potassium: 3.9 mmol/L (ref 3.5–5.1)
Sodium: 141 mmol/L (ref 135–145)
Total Bilirubin: 0.8 mg/dL (ref 0.3–1.2)
Total Protein: 7.1 g/dL (ref 6.5–8.1)

## 2020-12-14 LAB — ECHOCARDIOGRAM COMPLETE
Area-P 1/2: 3.12 cm2
P 1/2 time: 1263 msec
S' Lateral: 2.2 cm
Weight: 2462.1 oz

## 2020-12-14 LAB — HEMOGLOBIN A1C
Hgb A1c MFr Bld: 5.9 % — ABNORMAL HIGH (ref 4.8–5.6)
Mean Plasma Glucose: 123 mg/dL

## 2020-12-14 LAB — VITAMIN B12: Vitamin B-12: 516 pg/mL (ref 180–914)

## 2020-12-14 LAB — CBG MONITORING, ED: Glucose-Capillary: 98 mg/dL (ref 70–99)

## 2020-12-14 MED ORDER — CLOPIDOGREL BISULFATE 75 MG PO TABS
75.0000 mg | ORAL_TABLET | Freq: Every day | ORAL | Status: DC
  Administered 2020-12-14 – 2020-12-15 (×2): 75 mg via ORAL
  Filled 2020-12-14 (×2): qty 1

## 2020-12-14 MED ORDER — ROSUVASTATIN CALCIUM 5 MG PO TABS
10.0000 mg | ORAL_TABLET | Freq: Every morning | ORAL | Status: DC
  Administered 2020-12-14 – 2020-12-15 (×2): 10 mg via ORAL
  Filled 2020-12-14 (×2): qty 2

## 2020-12-14 NOTE — ED Notes (Signed)
Patient is resting comfortably. Will continue to monitor for any new onset or worsening of previous episode of confusion.

## 2020-12-14 NOTE — Progress Notes (Signed)
EEG complete - results pending 

## 2020-12-14 NOTE — Care Management Obs Status (Signed)
MEDICARE OBSERVATION STATUS NOTIFICATION   Patient Details  Name: Denise Riley MRN: [id number] Date of Birth: 10/20/1950   Medicare Observation Status Notification Given:  Yes    Oletta Cohn, RN 12/14/2020, 8:54 AM

## 2020-12-14 NOTE — ED Notes (Signed)
Ambulated pt to bathroom with assistance. Pt urinated, and provided with clean brief. Pt placed back in bed.

## 2020-12-14 NOTE — Progress Notes (Signed)
Patient arrived to 3w-34. Patient reports she can find her glasses and no glasses were with her patient belongings when arrived. RN Runell Gess called back to ED RN and she states that she did not see any glasses but would tube them up if any were found.

## 2020-12-14 NOTE — ED Notes (Signed)
Pt noted to be up roaming in room with adult brief thrown on the floor, stating she needed to make up her bed and go to work. Pt redirected by nursing staff but continues to display signs of confusion. Pt redirected to bed, new adult brief applied and bed linen change performed. Pt placed into hallway bed near nurses station for visual monitoring. Will continue to assess for any further need for intervention.

## 2020-12-14 NOTE — Progress Notes (Signed)
Subjective:   Hospital day: 1  Overnight event: No acute events overnight  [name] reports she was feeling well until she realized she was at the wrong hospital. She said she was supposed to be at [facility] and states that she is at "[facility]". She reports she was told she had a "mini-stroke" and she does not have a history of these in the past. She is requesting to update her daughter, Denise Riley. She also notes that she cannot see well without her glasses. Denies any weakness or numbness at this time. She does endorse a headache with blurry vision but attributes visoin to not wearing glasses. Headache is new since admission. No other acute concerns at this time.   Objective:  Vital signs in last 24 hours: Vitals:   12/14/20 0245 12/14/20 0400 12/14/20 0756 12/14/20 0823  BP: (!) 165/69 (!) 137/104 (!) 171/91   Pulse: 70 86 76   Resp: 15 (!) 21 16   Temp:    (!) 97.5 F (36.4 C)  TempSrc:    Oral  SpO2: 100% 100% 100%   Weight:        Filed Weights   12/13/20 1700  Weight: 69.8 kg    No intake or output data in the 24 hours ending 12/14/20 0828 Net IO Since Admission: No IO data has been entered for this period [12/14/20 0828]  Recent Labs    12/13/20 1721 12/14/20 0755  GLUCAP 93 98     Pertinent Labs: CBC Latest Ref Rng & Units 12/14/2020 12/13/2020 12/13/2020  WBC 4.0 - 10.5 K/uL 7.0 - 6.3  Hemoglobin 12.0 - 15.0 g/dL 96.7 89.3 81.0  Hematocrit 36.0 - 46.0 % 38.5 36.0 39.9  Platelets 150 - 400 K/uL 277 - 243    CMP Latest Ref Rng & Units 12/14/2020 12/13/2020 12/13/2020  Glucose 70 - 99 mg/dL 175(Z) 025(E) 527(P)  BUN 8 - 23 mg/dL 10 13 12   Creatinine 0.44 - 1.00 mg/dL 8.24 2.35  Sodium 135 - 145 mmol/L 141 141 139  Potassium 3.5 - 5.1 mmol/L 3.9 4.0 3.9  Chloride 98 - 111 mmol/L 106 104 102  CO2 22 - 32 mmol/L 27 - 29  Calcium 8.9 - 10.3 mg/dL 9.8 - 9.6  Total Protein 6.5 - 8.1 g/dL 7.1 - 6.9  Total Bilirubin 0.3 - 1.2 mg/dL  0.8 - 0.7  Alkaline Phos 38 - 126 U/L 101 - 98  AST 15 - 41 U/L 24 - 26  ALT 0 - 44 U/L 29 - 29    Imaging: MR ANGIO HEAD WO CONTRAST  Result Date: 12/14/2020 CLINICAL DATA:  Stroke follow-up.  Lethargy EXAM: MRI HEAD WITHOUT CONTRAST MRA HEAD WITHOUT CONTRAST TECHNIQUE: Multiplanar, multi-echo pulse sequences of the brain and surrounding structures were acquired without intravenous contrast. Angiographic images of the Circle of Willis were acquired using MRA technique without intravenous contrast. COMPARISON: No pertinent prior exam. COMPARISON:  No pertinent prior exam. FINDINGS: MRI HEAD FINDINGS Brain: No acute infarct, mass effect or extra-axial collection. No acute or chronic hemorrhage. Hyperintense T2-weighted signal is moderately widespread throughout the white matter. Generalized volume loss without a clear lobar predilection. The midline structures are normal. Vascular: Major flow voids are preserved. Skull and upper cervical spine: Normal calvarium and skull base. Visualized upper cervical spine and soft tissues are normal. Sinuses/Orbits:No paranasal sinus fluid levels or advanced mucosal thickening. No mastoid or middle ear effusion. Normal orbits. MRA HEAD FINDINGS POSTERIOR CIRCULATION: --Vertebral arteries:  Normal --Inferior cerebellar arteries: Normal. --Basilar artery: Normal. --Superior cerebellar arteries: Normal. --Posterior cerebral arteries: Normal. ANTERIOR CIRCULATION: --Intracranial internal carotid arteries: Normal. --Anterior cerebral arteries (ACA): Normal. --Middle cerebral arteries (MCA): Normal. ANATOMIC VARIANTS: None IMPRESSION: 1. No acute intracranial abnormality. 2. Moderate chronic small vessel ischemic changes. 3. Normal intracranial MRA. Electronically Signed   By: Deatra Robinson M.D.   On: 12/14/2020 00:15   MR BRAIN WO CONTRAST  Result Date: 12/14/2020 CLINICAL DATA:  Stroke follow-up.  Lethargy EXAM: MRI HEAD WITHOUT CONTRAST MRA HEAD WITHOUT CONTRAST  TECHNIQUE: Multiplanar, multi-echo pulse sequences of the brain and surrounding structures were acquired without intravenous contrast. Angiographic images of the Circle of Willis were acquired using MRA technique without intravenous contrast. COMPARISON: No pertinent prior exam. COMPARISON:  No pertinent prior exam. FINDINGS: MRI HEAD FINDINGS Brain: No acute infarct, mass effect or extra-axial collection. No acute or chronic hemorrhage. Hyperintense T2-weighted signal is moderately widespread throughout the white matter. Generalized volume loss without a clear lobar predilection. The midline structures are normal. Vascular: Major flow voids are preserved. Skull and upper cervical spine: Normal calvarium and skull base. Visualized upper cervical spine and soft tissues are normal. Sinuses/Orbits:No paranasal sinus fluid levels or advanced mucosal thickening. No mastoid or middle ear effusion. Normal orbits. MRA HEAD FINDINGS POSTERIOR CIRCULATION: --Vertebral arteries: Normal --Inferior cerebellar arteries: Normal. --Basilar artery: Normal. --Superior cerebellar arteries: Normal. --Posterior cerebral arteries: Normal. ANTERIOR CIRCULATION: --Intracranial internal carotid arteries: Normal. --Anterior cerebral arteries (ACA): Normal. --Middle cerebral arteries (MCA): Normal. ANATOMIC VARIANTS: None IMPRESSION: 1. No acute intracranial abnormality. 2. Moderate chronic small vessel ischemic changes. 3. Normal intracranial MRA. Electronically Signed   By: Deatra Robinson M.D.   On: 12/14/2020 00:15   DG Chest Port 1 View  Result Date: 12/13/2020 CLINICAL DATA:  Weakness EXAM: PORTABLE CHEST 1 VIEW COMPARISON:  None. FINDINGS: The heart size and mediastinal contours are within normal limits. Both lungs are clear. The visualized skeletal structures are unremarkable. IMPRESSION: No active disease. Electronically Signed   By: Alcide Clever M.D.   On: 12/13/2020 18:57   CT HEAD CODE STROKE WO CONTRAST  Result Date:  12/13/2020 CLINICAL DATA:  Code stroke. Neuro deficit, acute, stroke suspected. Additional history provided: Left-sided facial droop, left-sided weakness. EXAM: CT HEAD WITHOUT CONTRAST TECHNIQUE: Contiguous axial images were obtained from the base of the skull through the vertex without intravenous contrast. COMPARISON:  No pertinent prior exams available for comparison. FINDINGS: Brain: Moderate cerebral and cerebellar atrophy. Moderate/advanced patchy and confluent hypoattenuation within the cerebral white matter is nonspecific, but compatible with chronic small vessel ischemic disease. Chronic appearing lacunar infarct within the right cerebellar hemisphere. There is no acute intracranial hemorrhage. No demarcated cortical infarct. No extra-axial fluid collection. No evidence of intracranial mass. No midline shift. Vascular: No hyperdense vessel.  Atherosclerotic calcifications. Skull: Normal. Negative for fracture or focal lesion. Sinuses/Orbits: Visualized orbits show no acute finding. No significant paranasal sinus disease at the imaged levels. Other: Left mastoid effusion ASPECTS (Alberta Stroke Program Early CT Score) - Ganglionic level infarction (caudate, lentiform nuclei, internal capsule, insula, M1-M3 cortex): 7 - Supraganglionic infarction (M4-M6 cortex): 3 Total score (0-10 with 10 being normal): 10 These results were communicated to Dr. Otelia Limes at 5:38 pmon 6/19/2022by text page via the [facility] messaging system. IMPRESSION: No evidence of acute intracranial abnormality.  ASPECTS is 10. Moderate generalized parenchymal atrophy. Moderate/advanced cerebral white matter chronic small vessel ischemic disease. Chronic appearing lacunar infarct within the right cerebellar hemisphere. Left mastoid effusion. Electronically  Signed   By: Jackey Loge DO   On: 12/13/2020 17:38    Physical Exam  General: Pleasant, well-appearing elderly woman sitting on hallway bed. No acute distress. HEENT: [place]/AT. PERRLA.  MMM. CV: RRR. No m/r/g. No LE edema Pulmonary: Lungs CTAB. Normal effort. No wheezing or rales. Abdominal: Soft, nontender, nondistended. Normal bowel sounds. Extremities: 2+ distal pulses. Normal ROM. Skin: Warm and dry. No obvious rash or lesions. Neuro: Alert and oriented x 3. No facial droop. Normal FTN testing. Moves all extremities. Normal sensation. 4/5 strength in all extremities.  Psych: Normal mood and affect  Assessment/Plan: Denise Riley is a 70 y.o. female with vascular dementia, paranoid personality disorder, HTN, HLD, vitamin D deficiency, anemia, major depressive disorder,  who presented with Left arm weakness and dysarthria that has since resolved and admit for transient ischemic attack.   Active Problems:   TIA (transient ischemic attack)  Transient Ischemic Attack Patient's neurological symptoms resolved on admission. Patient able to talk clearly on our evaluation. She has 4/5 weakness in lower extremities but no focal neurological deficits. MRI brain with some chronic small vessel ischemic changes but no acute intracranial abnormality. MRA unremarkable. Normal carotid Dopplers. Echo shows mild pericardial effusion but normal EF of 55-60%, G1DD, no regional wall motion abnormalities, LVH or valvular abnormalities. Patient has passed swallow screen. Patient seem to be back to her baseline. --Neuro following, appreciate recs --Follow-up EEG results --Pending A1c --Pending PT/OT eval  --Continue ASA 81 mg daily --Continue permissive HTN, treat SBP > 180 --Continue telemetry  HTN HLD Vascular Dementia: Patient has a history of HTN and HLD. She does not take any antihypertensive medications at home. BP improved to 133/78 this afternoon. Repeat lipid panel only significant for LDL of 104.  --Resume home rosuvastatin 10 mg daily  Paranoid mood disorder Major depressive disorder Stable. Patient takes Risperidone 2 mg daily and citalopram 20 mg daily. --Continue  Risperdal 2 mg daily --Continue citalopram 10 mg daily  Essential Tremors Patient has a history of tremors and is currently taking propranolol 60 mg daily. --Continue to hold propranolol  Diet: HH/CM IVF: None VTE: SQ Lovenox CODE: Full code  Prior to Admission Living Arrangement: SNF Anticipated Discharge Location: SNF  Barriers to Discharge: Stroke work-up Dispo: Anticipated discharge in 1-2 days  Signed: Steffanie Rainwater, MD 12/14/2020, 8:28 AM  Pager: [phone] Internal Medicine Teaching Service After 5pm on weekdays and 1pm on weekends: On Call pager: [phone]

## 2020-12-14 NOTE — Procedures (Signed)
Patient Name: Denise Riley  MRN: [id number]  Epilepsy Attending: Charlsie Quest  Referring Physician/Provider: Shon Hale, NP Date: 12/14/2020 Duration: 29.36 mins  Patient history: 70 year old female with left upper extremity weakness.  EEG to evaluate for seizures.  Level of alertness: Awake  AEDs during EEG study: None  Technical aspects: This EEG study was done with scalp electrodes positioned according to the 10-20 International system of electrode placement. Electrical activity was acquired at a sampling rate of 500Hz  and reviewed with a high frequency filter of 70Hz  and a low frequency filter of 1Hz . EEG data were recorded continuously and digitally stored.   Description: The posterior dominant rhythm consists of 8Hz  activity of moderate voltage (25-35 uV) seen predominantly in posterior head regions, symmetric and reactive to eye opening and eye closing. EEG showed intermittent 2- 3 Hz delta slowing in right temporal region. Hyperventilation and photic stimulation were not performed.     ABNORMALITY - Intermittent slow, right temporal region  IMPRESSION: This study is suggestive of nonspecific cortical dysfunction in right temporal region.  No seizures or epileptiform discharges were seen throughout the recording.  If suspicion for interictal activity remains a concern, a prolonged study can be considered.   Denise Riley 

## 2020-12-14 NOTE — Progress Notes (Signed)
PT Cancellation Note  Patient Details Name: Adisson Deak MRN: [id number] DOB: Apr 11, 1951   Cancelled Treatment:    Reason Eval/Treat Not Completed: Patient at procedure or test/unavailable Pt currently having EEG. Will follow up as schedule allows.   Cindee Salt, DPT  Acute Rehabilitation Services  Pager: [phone] Office: [phone]    Lehman Prom 12/14/2020, 2:55 PM

## 2020-12-14 NOTE — Progress Notes (Signed)
STROKE TEAM PROGRESS NOTE   INTERVAL HISTORY No visitors at the bedside.  Patient is resting on ED stretcher in NAD. She is attentive and conversant with clear speech and fluent language. She is confused to date, season, place. She states she is here for a ministroke. She is unable to stay on topic and required frequent guidance to answer questions. She is following simple but not multi-step commands. MRI scan of the brain is negative for acute infarct.  MRI of the brain shows no significant large vessel stenosis or occlusion.  Carotid ultrasound and echocardiogram are pending We explained her ongoing work up and plan of care.  Vitals:   12/14/20 0823 12/14/20 1222 12/14/20 1257 12/14/20 1658  BP:  129/62 133/78 134/78  Pulse:  78 85 89  Resp:  16  18  Temp: (!) 97.5 F (36.4 C) 97.6 F (36.4 C)  97.9 F (36.6 C)  TempSrc: Oral Oral    SpO2:  99%  98%  Weight:       CBC:  Recent Labs  Lab 12/13/20 1724 12/13/20 1729 12/14/20 0108  WBC 6.3  --  7.0  NEUTROABS 4.6  --   --   HGB 12.7 12.2 12.7  HCT 39.9 36.0 38.5  MCV 99.0  --  97.2  PLT 243  --  277   Basic Metabolic Panel:  Recent Labs  Lab 12/13/20 1724 12/13/20 1729 12/13/20 2128 12/14/20 0108  NA 139 141  --  141  K 3.9 4.0  --  3.9  CL 102 104  --  106  CO2 29  --   --  27  GLUCOSE 105* 102*  --  105*  BUN 12 13  --  10  CREATININE 0.62 0.60  --  0.58  CALCIUM 9.6  --   --  9.8  MG  --   --  2.1  --   PHOS  --   --  3.8  --    Lipid Panel:  Recent Labs  Lab 12/13/20 2128  CHOL 180  TRIG 34  HDL 69  CHOLHDL 2.6  VLDL 7  LDLCALC 104*   HgbA1c: No results for input(s): HGBA1C in the last 168 hours. Urine Drug Screen: No results for input(s): LABOPIA, COCAINSCRNUR, LABBENZ, AMPHETMU, THCU, LABBARB in the last 168 hours.  Alcohol Level No results for input(s): ETH in the last 168 hours.  IMAGING past 24 hours MR ANGIO HEAD WO CONTRAST  Result Date: 12/14/2020 CLINICAL DATA:  Stroke follow-up.   Lethargy EXAM: MRI HEAD WITHOUT CONTRAST MRA HEAD WITHOUT CONTRAST TECHNIQUE: Multiplanar, multi-echo pulse sequences of the brain and surrounding structures were acquired without intravenous contrast. Angiographic images of the Circle of Willis were acquired using MRA technique without intravenous contrast. COMPARISON: No pertinent prior exam. COMPARISON:  No pertinent prior exam. FINDINGS: MRI HEAD FINDINGS Brain: No acute infarct, mass effect or extra-axial collection. No acute or chronic hemorrhage. Hyperintense T2-weighted signal is moderately widespread throughout the white matter. Generalized volume loss without a clear lobar predilection. The midline structures are normal. Vascular: Major flow voids are preserved. Skull and upper cervical spine: Normal calvarium and skull base. Visualized upper cervical spine and soft tissues are normal. Sinuses/Orbits:No paranasal sinus fluid levels or advanced mucosal thickening. No mastoid or middle ear effusion. Normal orbits. MRA HEAD FINDINGS POSTERIOR CIRCULATION: --Vertebral arteries: Normal --Inferior cerebellar arteries: Normal. --Basilar artery: Normal. --Superior cerebellar arteries: Normal. --Posterior cerebral arteries: Normal. ANTERIOR CIRCULATION: --Intracranial internal carotid arteries: Normal. --Anterior cerebral arteries (  ACA): Normal. --Middle cerebral arteries (MCA): Normal. ANATOMIC VARIANTS: None IMPRESSION: 1. No acute intracranial abnormality. 2. Moderate chronic small vessel ischemic changes. 3. Normal intracranial MRA. Electronically Signed   By: Deatra Robinson M.D.   On: 12/14/2020 00:15   MR BRAIN WO CONTRAST  Result Date: 12/14/2020 CLINICAL DATA:  Stroke follow-up.  Lethargy EXAM: MRI HEAD WITHOUT CONTRAST MRA HEAD WITHOUT CONTRAST TECHNIQUE: Multiplanar, multi-echo pulse sequences of the brain and surrounding structures were acquired without intravenous contrast. Angiographic images of the Circle of Willis were acquired using MRA technique  without intravenous contrast. COMPARISON: No pertinent prior exam. COMPARISON:  No pertinent prior exam. FINDINGS: MRI HEAD FINDINGS Brain: No acute infarct, mass effect or extra-axial collection. No acute or chronic hemorrhage. Hyperintense T2-weighted signal is moderately widespread throughout the white matter. Generalized volume loss without a clear lobar predilection. The midline structures are normal. Vascular: Major flow voids are preserved. Skull and upper cervical spine: Normal calvarium and skull base. Visualized upper cervical spine and soft tissues are normal. Sinuses/Orbits:No paranasal sinus fluid levels or advanced mucosal thickening. No mastoid or middle ear effusion. Normal orbits. MRA HEAD FINDINGS POSTERIOR CIRCULATION: --Vertebral arteries: Normal --Inferior cerebellar arteries: Normal. --Basilar artery: Normal. --Superior cerebellar arteries: Normal. --Posterior cerebral arteries: Normal. ANTERIOR CIRCULATION: --Intracranial internal carotid arteries: Normal. --Anterior cerebral arteries (ACA): Normal. --Middle cerebral arteries (MCA): Normal. ANATOMIC VARIANTS: None IMPRESSION: 1. No acute intracranial abnormality. 2. Moderate chronic small vessel ischemic changes. 3. Normal intracranial MRA. Electronically Signed   By: Deatra Robinson M.D.   On: 12/14/2020 00:15   DG Chest Port 1 View  Result Date: 12/13/2020 CLINICAL DATA:  Weakness EXAM: PORTABLE CHEST 1 VIEW COMPARISON:  None. FINDINGS: The heart size and mediastinal contours are within normal limits. Both lungs are clear. The visualized skeletal structures are unremarkable. IMPRESSION: No active disease. Electronically Signed   By: Alcide Clever M.D.   On: 12/13/2020 18:57   EEG adult  Result Date: 12/14/2020 Charlsie Quest, MD     12/14/2020  5:32 PM Patient Name: Denise Riley MRN: [id number] Epilepsy Attending: Charlsie Quest Referring Physician/Provider: Shon Hale, NP Date: 12/14/2020 Duration: 29.36 mins Patient  history: 70 year old female with left upper extremity weakness.  EEG to evaluate for seizures. Level of alertness: Awake AEDs during EEG study: None Technical aspects: This EEG study was done with scalp electrodes positioned according to the 10-20 International system of electrode placement. Electrical activity was acquired at a sampling rate of 500Hz  and reviewed with a high frequency filter of 70Hz  and a low frequency filter of 1Hz . EEG data were recorded continuously and digitally stored. Description: The posterior dominant rhythm consists of 8Hz  activity of moderate voltage (25-35 uV) seen predominantly in posterior head regions, symmetric and reactive to eye opening and eye closing. EEG showed intermittent 2- 3 Hz delta slowing in right temporal region. Hyperventilation and photic stimulation were not performed.   ABNORMALITY - Intermittent slow, right temporal region IMPRESSION: This study is suggestive of nonspecific cortical dysfunction in right temporal region.  No seizures or epileptiform discharges were seen throughout the recording. If suspicion for interictal activity remains a concern, a prolonged study can be considered.   ECHOCARDIOGRAM COMPLETE  Result Date: 12/14/2020    ECHOCARDIOGRAM REPORT   Patient Name:   KALKIDAN CAUDELL Date of Exam: 12/14/2020 Medical Rec #:  Charlsie Quest      Height: Accession #:    12/16/2020     Weight:  153.9 lb Date of Birth:  12-02-1950       BSA:          1.696 m Patient Age:    70 years       BP:           171/91 mmHg Patient Gender: F              HR:           76 bpm. Exam Location:  Inpatient Procedure: 2D Echo, Cardiac Doppler and Color Doppler Indications:    CVA  History:        Patient has no prior history of Echocardiogram examinations.                 TIA, Signs/Symptoms:Altered Mental Status; Risk                 Factors:Hypertension and Dyslipidemia.  Sonographer:    Lavenia Atlas Referring Phys: [phone] DANIELLE RAY  Sonographer  Comments: Technically difficult study due to poor echo windows. Image acquisition challenging due to uncooperative patient and AMS. IMPRESSIONS  1. Left ventricular ejection fraction, by estimation, is 55 to 60%. The left ventricle has normal function. The left ventricle has no regional wall motion abnormalities. Left ventricular diastolic parameters are consistent with Grade I diastolic dysfunction (impaired relaxation).  2. Right ventricular systolic function is mildly reduced. The right ventricular size is normal. There is normal pulmonary artery systolic pressure.  3. The pericardial effusion is localized near the right ventricle.  4. The mitral valve is grossly normal. Trivial mitral valve regurgitation.  5. The aortic valve is tricuspid. Aortic valve regurgitation is trivial. Mild aortic valve sclerosis is present, with no evidence of aortic valve stenosis.  6. The inferior vena cava is normal in size with greater than 50% respiratory variability, suggesting right atrial pressure of 3 mmHg. Comparison(s): No prior Echocardiogram. FINDINGS  Left Ventricle: Left ventricular ejection fraction, by estimation, is 55 to 60%. The left ventricle has normal function. The left ventricle has no regional wall motion abnormalities. The left ventricular internal cavity size was normal in size. There is  no left ventricular hypertrophy. Left ventricular diastolic parameters are consistent with Grade I diastolic dysfunction (impaired relaxation). Indeterminate filling pressures. Right Ventricle: The right ventricular size is normal. No increase in right ventricular wall thickness. Right ventricular systolic function is mildly reduced. There is normal pulmonary artery systolic pressure. The tricuspid regurgitant velocity is 1.92 m/s, and with an assumed right atrial pressure of 3 mmHg, the estimated right ventricular systolic pressure is 17.7 mmHg. Left Atrium: Left atrial size was normal in size. Right Atrium: Right atrial  size was normal in size. Pericardium: Trivial pericardial effusion is present. The pericardial effusion is localized near the right ventricle. Mitral Valve: The mitral valve is grossly normal. Trivial mitral valve regurgitation. Tricuspid Valve: The tricuspid valve is grossly normal. Tricuspid valve regurgitation is trivial. Aortic Valve: The aortic valve is tricuspid. Aortic valve regurgitation is trivial. Aortic regurgitation PHT measures 1263 msec. Mild aortic valve sclerosis is present, with no evidence of aortic valve stenosis. Pulmonic Valve: The pulmonic valve was normal in structure. Pulmonic valve regurgitation is not visualized. Aorta: The aortic root and ascending aorta are structurally normal, with no evidence of dilitation. Venous: The inferior vena cava is normal in size with greater than 50% respiratory variability, suggesting right atrial pressure of 3 mmHg. IAS/Shunts: The interatrial septum was not well visualized.  LEFT VENTRICLE PLAX 2D LVIDd:  3.20 cm  Diastology LVIDs:         2.20 cm  LV e' medial:    4.03 cm/s LV PW:         0.90 cm  LV E/e' medial:  14.1 LV IVS:        0.70 cm  LV e' lateral:   5.22 cm/s LVOT diam:     1.80 cm  LV E/e' lateral: 10.9 LV SV:         53 LV SV Index:   31 LVOT Area:     2.54 cm  RIGHT VENTRICLE RV Basal diam:  2.10 cm RV S prime:     8.05 cm/s TAPSE (M-mode): 1.8 cm LEFT ATRIUM             Index       RIGHT ATRIUM           Index LA diam:        2.60 cm 1.53 cm/m  RA Area:     12.40 cm LA Vol (A2C):   33.7 ml 19.88 ml/m RA Volume:   29.90 ml  17.63 ml/m LA Vol (A4C):   32.9 ml 19.40 ml/m LA Biplane Vol: 33.6 ml 19.82 ml/m  AORTIC VALVE LVOT Vmax:   105.00 cm/s LVOT Vmean:  74.600 cm/s LVOT VTI:    0.209 m AI PHT:      1263 msec  AORTA Ao Root diam: 2.30 cm MITRAL VALVE               TRICUSPID VALVE MV Area (PHT): 3.12 cm    TR Peak grad:   14.7 mmHg MV Decel Time: 243 msec    TR Vmax:        192.00 cm/s MV E velocity: 57.00 cm/s MV A velocity:  88.30 cm/s  SHUNTS MV E/A ratio:  0.65        Systemic VTI:  0.21 m                            Systemic Diam: 1.80 cm Zoila Shutter MD Electronically signed by Zoila Shutter MD Signature Date/Time: 12/14/2020/1:17:58 PM    Final    VAS US CAROTID  Result Date: 12/14/2020 Carotid Arterial Duplex Study Patient Name:  Denise Riley  Date of Exam:   12/14/2020 Medical Rec #: [id number]       Accession #:    [id number] Date of Birth: 11/30/1950        Patient Gender: F Patient Age:   70Y Exam Location:  [facility] Procedure:      VAS US CAROTID Referring Phys: [id number] DANIELLE RAY --------------------------------------------------------------------------------  Indications:       CVA. Comparison Study:  No prior studies. Performing Technologist: Jean Rosenthal RDMS,RVT  Examination Guidelines: A complete evaluation includes B-mode imaging, spectral Doppler, color Doppler, and power Doppler as needed of all accessible portions of each vessel. Bilateral testing is considered an integral part of a complete examination. Limited examinations for reoccurring indications may be performed as noted.  Right Carotid Findings: +----------+--------+--------+--------+------------------+------------------+           PSV cm/sEDV cm/sStenosisPlaque DescriptionComments           +----------+--------+--------+--------+------------------+------------------+ CCA Prox  101     11                                                   +----------+--------+--------+--------+------------------+------------------+  CCA Distal59      10                                intimal thickening +----------+--------+--------+--------+------------------+------------------+ ICA Prox  53      14      1-39%   heterogenous                         +----------+--------+--------+--------+------------------+------------------+ ICA Distal87      27                                                    +----------+--------+--------+--------+------------------+------------------+ ECA       114                                                          +----------+--------+--------+--------+------------------+------------------+ +----------+--------+-------+----------------+-------------------+           PSV cm/sEDV cmsDescribe        Arm Pressure (mmHG) +----------+--------+-------+----------------+-------------------+ Subclavian162            Multiphasic, WNL                    +----------+--------+-------+----------------+-------------------+ +---------+--------+--+--------+--+---------+ VertebralPSV cm/s43EDV cm/s12Antegrade +---------+--------+--+--------+--+---------+  Left Carotid Findings: +----------+--------+--------+--------+------------------+------------------+           PSV cm/sEDV cm/sStenosisPlaque DescriptionComments           +----------+--------+--------+--------+------------------+------------------+ CCA Prox  108     11                                                   +----------+--------+--------+--------+------------------+------------------+ CCA Distal80      14                                intimal thickening +----------+--------+--------+--------+------------------+------------------+ ICA Prox  67      21      1-39%   heterogenous                         +----------+--------+--------+--------+------------------+------------------+ ICA Distal100     29                                                   +----------+--------+--------+--------+------------------+------------------+ ECA       104                                                          +----------+--------+--------+--------+------------------+------------------+ +----------+--------+--------+----------------+-------------------+           PSV cm/sEDV cm/sDescribe        Arm Pressure (mmHG) +----------+--------+--------+----------------+-------------------+  [id number]             Multiphasic, WNL                    +----------+--------+--------+----------------+-------------------+ +---------+--------+--+--------+--+---------+ VertebralPSV cm/s77EDV cm/s15Antegrade +---------+--------+--+--------+--+---------+   Summary: Right Carotid: Velocities in the right ICA are consistent with a 1-39% stenosis. Left Carotid: Velocities in the left ICA are consistent with a 1-39% stenosis. Vertebrals:  Bilateral vertebral arteries demonstrate antegrade flow. Subclavians: Normal flow hemodynamics were seen in bilateral subclavian              arteries. *See table(s) above for measurements and observations.  Electronically signed by Delia Heady MD on 12/14/2020 at 3:10:36 PM.    Final     PHYSICAL EXAM  GENERAL: Elderly lady aying in EMS stretcher, in no acute distress Head: Normocephalic and atraumatic EENT: Normal conjunctivae, no OP obstruction LUNGS: Normal respiratory effort. Non-labored breathing CV: RRR on tele ABDOMEN - Soft, non-tender Ext: warm, well perfused, without obvious abnormality   NEURO: Mental Status: Drowsy but wakes easily to voice, alert, and oriented to person, place, year, month, state. She does incorrectly state the city initially. She is able to provide a clear description of present events in the context of short and poorly detailed answers to questions. Concentration is fair - she is able to name the months of the year from January to August but then loses track. She has a subtle dysarthria and is not aphasic. Naming, repetition, fluency, and comprehension intact. No neglect is noted.  Cranial Nerves: II: PERRL. Visual fields full. III, IV, VI: EOMI without ptosis V: Sensation is intact to light touch and symmetrical to face. VII: Face is subtly asymmetric with smiling with slightly less elevation of left mouth. VIII: Hearing is intact to voice IX, X: Palate elevation is symmetric. Phonation normal. XI: Normal  sternocleidomastoid and trapezius muscle strength XII: Tongue protrudes midline without fasciculations.   Motor: 5/5 strength present in bilateral lower extremities and 4+/5 strength present in bilateral upper extremities. No vertical drift noted throughout extremities.  Tone is normal. Bulk is normal. Sensation: Intact to light touch and cool temperature bilaterally in all four extremities. Coordination: FTN intact bilaterally. No pronator drift. DTRs: 3+ and symmetric patellae, 3+ right biceps and brachioradialis, 2+ left bicep and brachioradialis Plantars: Right: upgoing Left: downgoing  ASSESSMENT/PLAN 70 y.o. female with PMH significant for vascular dementia with behavior disturbance, paranoid personality disorder, frequent falls, essential hypertension, hyperlipidemia, vitamin D deficiency, anemia, and major depressive disorder who presented as a Code Stroke for evaluation of left upper extremity weakness, lethargy, and dysarthria with no stroke seen on MRI scan.  Stroke like episode, possible TIA consisting of transient left upper extremity weakness, lethargy, and dysarthria.  CTH Code Stroke:  No evidence of acute intracranial abnormality MRI  1. No acute intracranial abnormality. 2. Moderate chronic small vessel ischemic changes. 3. Normal intracranial MRA. MRA   1. No acute intracranial abnormality. 2D Echo EF 55-60%, No thrombus, wall motion abnormality. The interatrial septum was not well visualized.  EEG: suggestive of nonspecific cortical dysfunction in right temporal region.  No seizures or epileptiform discharges were seen throughout the recording. Carotid Doppler:  Right Carotid: Velocities in the right ICA are consistent with a 1-39%  stenosis.  Left Carotid: Velocities in the left ICA are consistent with a 1-39%  stenosis.  Vertebrals:  Bilateral vertebral arteries demonstrate antegrade flow.  Subclavians: Normal flow hemodynamics were seen in bilateral subclavian  arteries.  LDL 104  HgbA1c pending VTE prophylaxis - is recommended    Diet   Diet heart healthy/carb modified Room service appropriate? Yes; Fluid consistency: Thin  Not on AC/AP prior to admission Recommend aspirin 81 mg and Plavix 75 mg daily into 3 weeks followed by aspirin 81 mg daily alone  therapy recommendations:  TBD Disposition:  TBD  Hypertension Stable Permissive hypertension (OK if < 220/120) but gradually normalize in 5-7 days Long-term BP goal normotensive  Hyperlipidemia LDL 104, not at goal < 70 Home dose of Crestor 10 mg on board. Recommend increase to 20mg .  Continue statin at discharge  Vascular dementia, known Acutely confused, unclear baseline B12 normal, TSH normal RPR is pending  Other Stroke Risk Factors Advanced Age >/= 4565  Previous stroke  Other Active Problems   Hospital day # 1  I have personally obtained history,examined this patient, reviewed notes, independently viewed imaging studies, participated in medical decision making and plan of care.ROS completed by me personally and pertinent positives fully documented  I have made any additions or clarifications directly to the above note. Agree with note above.  Patient presented with transient left facial droop and left-sided weakness which appears to have improved.  MRI scan is negative for acute stroke.  Recommend continue ongoing stroke work-up.  She also appears to have cognitive impairment at baseline hence recommend checking for reversible causes including EEG, B12, TSH and RPR.Recommend aspirin 81 mg and Plavix 75 mg daily into 3 weeks followed by aspirin 81 mg daily alone.  Greater than 50% time during this 35-minute visit was spent in counseling and coordination of care and discussion about stroke prevention and treatment and answering questions.  Delia HeadyPramod Lanny Lipkin, MD Medical Director [facility] Pager: [phone] 12/14/2020 6:33 PM   To contact Stroke Continuity provider,  please refer to [email or web address]. After hours, contact General Neurology

## 2020-12-14 NOTE — Progress Notes (Signed)
  Echocardiogram 2D Echocardiogram has been performed.  Pieter Partridge 12/14/2020, 10:56 AM

## 2020-12-14 NOTE — Care Management CC44 (Signed)
Condition Code 44 Documentation Completed  Patient Details  Name: Denise Riley MRN: [id number] Date of Birth: 04/27/1951   Condition Code 44 given:  Yes Patient signature on Condition Code 44 notice:  Yes Documentation of 2 MD's agreement:  Yes Code 44 added to claim:  Yes    Oletta Cohn, RN 12/14/2020, 8:55 AM

## 2020-12-14 NOTE — Progress Notes (Signed)
SLP Cancellation Note  Patient Details Name: Denise Riley MRN: [id number] DOB: 30-Nov-1950   Cancelled treatment:       Reason Eval/Treat Not Completed: Other (comment). Pt passed [facility] swallow screen and diet initiated. Will defer SLP swallow eval at this time.    Abimbola Aki, [name] 12/14/2020, 1:43 PM

## 2020-12-14 NOTE — ED Notes (Signed)
Breakfast orders placed 

## 2020-12-14 NOTE — Progress Notes (Addendum)
Limited assessment performed as the patient was agitated, uncooperative and would not follow commands.

## 2020-12-15 DIAGNOSIS — G459 Transient cerebral ischemic attack, unspecified: Secondary | ICD-10-CM | POA: Diagnosis not present

## 2020-12-15 LAB — RESP PANEL BY RT-PCR (FLU A&B, COVID) ARPGX2
Influenza A by PCR: NEGATIVE
Influenza B by PCR: NEGATIVE
SARS Coronavirus 2 by RT PCR: NEGATIVE

## 2020-12-15 LAB — RPR: RPR Ser Ql: NONREACTIVE

## 2020-12-15 MED ORDER — CLOPIDOGREL BISULFATE 75 MG PO TABS: 75.0000 mg | ORAL_TABLET | Freq: Every day | ORAL | 0 refills | Status: AC

## 2020-12-15 MED ORDER — CETIRIZINE HCL 10 MG PO TABS: 10.0000 mg | ORAL_TABLET | Freq: Every morning | ORAL | 1 refills | Status: AC

## 2020-12-15 MED ORDER — ROSUVASTATIN CALCIUM 20 MG PO TABS: 20.0000 mg | ORAL_TABLET | Freq: Every morning | ORAL | 2 refills | Status: AC

## 2020-12-15 MED ORDER — ROSUVASTATIN CALCIUM 20 MG PO TABS: 20.0000 mg | ORAL_TABLET | Freq: Every morning | ORAL | Status: DC

## 2020-12-15 MED ORDER — ASPIRIN 81 MG PO TBEC: 81.0000 mg | DELAYED_RELEASE_TABLET | Freq: Every day | ORAL | 11 refills | Status: AC

## 2020-12-15 NOTE — Discharge Summary (Signed)
Name: Denise Riley MRN: [id number] DOB: 1950-10-09 70 y.o. PCP: Lazaro Arms, PA-C  Date of Admission: 12/13/2020  5:22 PM Date of Discharge:   12/15/2020 Attending Physician: Gust Rung, DO  Discharge Diagnosis: Transient ischemic attack  Discharge Medications: Allergies as of 12/15/2020       Reactions   Erythromycin Other (See Comments)   Unknown reaction   Penicillins Other (See Comments)   Unknown reaction        Medication List     TAKE these medications    aspirin 81 MG EC tablet Take 1 tablet (81 mg total) by mouth daily. Swallow whole. Start taking on: December 16, 2020   cetirizine 10 MG tablet Commonly known as: ZYRTEC Take 1 tablet (10 mg total) by mouth every morning. What changed: how much to take   citalopram 20 MG tablet Commonly known as: CELEXA Take 20 mg by mouth every morning.   clopidogrel 75 MG tablet Commonly known as: PLAVIX Take 1 tablet (75 mg total) by mouth daily for 21 days. Start taking on: December 16, 2020   multivitamin with minerals Tabs tablet Take 1 tablet by mouth every morning.   propranolol 60 MG tablet Commonly known as: INDERAL Take 60 mg by mouth every morning. For HTN and tremors   risperiDONE 2 MG tablet Commonly known as: RISPERDAL Take 2 mg by mouth at bedtime. For paranoid psychosis   rosuvastatin 20 MG tablet Commonly known as: CRESTOR Take 1 tablet (20 mg total) by mouth every morning. Start taking on: December 16, 2020 What changed:  medication strength how much to take   Vitamin D 50 MCG (2000 UT) tablet Take 2,000 Units by mouth every morning.        Disposition and follow-up:   [name] was discharged from [facility] in Stable condition.  At the hospital follow up visit please address:  1.  Transient ischemic attack: Patient back to baseline. No focal neurodeficits.  Examine for any new neurological symptoms. Please ensure patient takes Plavix 75 mg plus aspirin 81 mg  for 3 weeks then aspirin 81 mg alone indefinitely.   2.  Labs / imaging needed at time of follow-up: None  3.  Pending labs/ test needing follow-up: None  Follow-up Appointments: One week follow-up with PCP or Dr. Marlis Edelson office.   Hospital Course by problem list: Transient Ischemic Attack: Patient presented for evaluation of left arm weakness, dysarthria, facial drop and dizziness. Code stroke was called and patients symptoms appeared to have resolved on presentation. Initial CT head wo contrast showed moderate to advanced small vessel ischemic disease and chronic appearing lacunar infarct within the right cerebellar hemisphere but no acute intracranial abnormalities. MRI brain with some chronic small vessel ischemic changes but no acute intracranial abnormality. MRA unremarkable. Normal carotid Dopplers. Echo shows mild pericardial effusion but normal EF of 55-60%, G1DD, no regional wall motion abnormalities, LVH or valvular abnormalities. Patient passed swallow screen.  PT/OT recommended SNF placement. Neuro recommended out+5x for 3 weeks then aspirin alone. Further work up is pending to assess risk factors for further strokes in the future. Patient was discharged back to her nursing home on day 2 of hospitalization. Patient is instructed to follow-up with her primary care provider in 1 week for hospital follow-up.   HTN HLD Vascular Dementia: Patient has a history of HTN and HLD. She does not take any antihypertensive medications at home. Systolic BP remains in the 130s to 150s throughout hospitalization. BP at  discharge was 153/70. Repeat lipid panel was only significant for LDL of 104.  Patient was on rosuvastatin 10 mg daily at home. This was increased to 20 mg daily and discharged on this new dose.   Paranoid mood disorder Major depressive disorder Patient takes Risperidone 2 mg daily and citalopram 20 mg daily. She did not appear to be acutely encephalopathic on admission. We continued  these medications on admission.    Essential Tremors: Patient has a history of tremors and is currently taking propranolol 60 mg daily.  We held her propranolol due to permissive HTN. Her propranolol was restarted at discharge.  Subjective:  Patient was evaluated at the bedside laying comfortably in bed. Patient states she was feeling fine but did not know if she felt different from how she fell yesterday.  She denied any dizziness or weakness.   Discharge Exam:   BP (!) 153/70 (BP Location: Right Arm)   Pulse 75   Temp 98.8 F (37.1 C) (Oral)   Resp 16   Wt 69.8 kg   SpO2 96%  General: Pleasant, well-appearing elderly woman laying in bed. No acute distress. CV: RRR. No m/r/g. No LE edema Pulmonary: Lungs CTAB. Normal effort. No wheezing or rales. Abdominal: Soft, nontender, nondistended. Normal bowel sounds. Extremities: 2+ distal pulses. Normal ROM. Skin: Warm and dry. No obvious rash or lesions. Neuro: Alert and oriented. No facial droop. Strength 4/5 in all extremities. Normal sensation. Psych: Normal mood and affect.  Pertinent Labs, Studies, and Procedures:  CBC Latest Ref Rng & Units 12/14/2020 12/13/2020 12/13/2020  WBC 4.0 - 10.5 K/uL 7.0 - 6.3  Hemoglobin 12.0 - 15.0 g/dL 16.112.7 09.612.2 04.512.7  Hematocrit 36.0 - 46.0 % 38.5 36.0 39.9  Platelets 150 - 400 K/uL 277 - 243   CMP Latest Ref Rng & Units 12/14/2020 12/13/2020 12/13/2020  Glucose 70 - 99 mg/dL 409(W105(H) 119(J102(H) 478(G105(H)  BUN 8 - 23 mg/dL 10 13 12   Creatinine 0.44 - 1.00 mg/dL 9.560.58 2.130.60 0.860.62  Sodium 135 - 145 mmol/L 141 141 139  Potassium 3.5 - 5.1 mmol/L 3.9 4.0 3.9  Chloride 98 - 111 mmol/L 106 104 102  CO2 22 - 32 mmol/L 27 - 29  Calcium 8.9 - 10.3 mg/dL 9.8 - 9.6  Total Protein 6.5 - 8.1 g/dL 7.1 - 6.9  Total Bilirubin 0.3 - 1.2 mg/dL 0.8 - 0.7  Alkaline Phos 38 - 126 U/L 101 - 98  AST 15 - 41 U/L 24 - 26  ALT 0 - 44 U/L 29 - 29   MR ANGIO HEAD WO CONTRAST  Result Date: 12/14/2020 CLINICAL DATA:  Stroke follow-up.   Lethargy EXAM: MRI HEAD WITHOUT CONTRAST MRA HEAD WITHOUT CONTRAST TECHNIQUE: Multiplanar, multi-echo pulse sequences of the brain and surrounding structures were acquired without intravenous contrast. Angiographic images of the Circle of Willis were acquired using MRA technique without intravenous contrast. COMPARISON: No pertinent prior exam. COMPARISON:  No pertinent prior exam. FINDINGS: MRI HEAD FINDINGS Brain: No acute infarct, mass effect or extra-axial collection. No acute or chronic hemorrhage. Hyperintense T2-weighted signal is moderately widespread throughout the white matter. Generalized volume loss without a clear lobar predilection. The midline structures are normal. Vascular: Major flow voids are preserved. Skull and upper cervical spine: Normal calvarium and skull base. Visualized upper cervical spine and soft tissues are normal. Sinuses/Orbits:No paranasal sinus fluid levels or advanced mucosal thickening. No mastoid or middle ear effusion. Normal orbits. MRA HEAD FINDINGS POSTERIOR CIRCULATION: --Vertebral arteries: Normal --Inferior cerebellar arteries: Normal. --  Basilar artery: Normal. --Superior cerebellar arteries: Normal. --Posterior cerebral arteries: Normal. ANTERIOR CIRCULATION: --Intracranial internal carotid arteries: Normal. --Anterior cerebral arteries (ACA): Normal. --Middle cerebral arteries (MCA): Normal. ANATOMIC VARIANTS: None IMPRESSION: 1. No acute intracranial abnormality. 2. Moderate chronic small vessel ischemic changes. 3. Normal intracranial MRA. Electronically Signed   By: Deatra Robinson M.D.   On: 12/14/2020 00:15   MR BRAIN WO CONTRAST  Result Date: 12/14/2020 CLINICAL DATA:  Stroke follow-up.  Lethargy EXAM: MRI HEAD WITHOUT CONTRAST MRA HEAD WITHOUT CONTRAST TECHNIQUE: Multiplanar, multi-echo pulse sequences of the brain and surrounding structures were acquired without intravenous contrast. Angiographic images of the Circle of Willis were acquired using MRA technique  without intravenous contrast. COMPARISON: No pertinent prior exam. COMPARISON:  No pertinent prior exam. FINDINGS: MRI HEAD FINDINGS Brain: No acute infarct, mass effect or extra-axial collection. No acute or chronic hemorrhage. Hyperintense T2-weighted signal is moderately widespread throughout the white matter. Generalized volume loss without a clear lobar predilection. The midline structures are normal. Vascular: Major flow voids are preserved. Skull and upper cervical spine: Normal calvarium and skull base. Visualized upper cervical spine and soft tissues are normal. Sinuses/Orbits:No paranasal sinus fluid levels or advanced mucosal thickening. No mastoid or middle ear effusion. Normal orbits. MRA HEAD FINDINGS POSTERIOR CIRCULATION: --Vertebral arteries: Normal --Inferior cerebellar arteries: Normal. --Basilar artery: Normal. --Superior cerebellar arteries: Normal. --Posterior cerebral arteries: Normal. ANTERIOR CIRCULATION: --Intracranial internal carotid arteries: Normal. --Anterior cerebral arteries (ACA): Normal. --Middle cerebral arteries (MCA): Normal. ANATOMIC VARIANTS: None IMPRESSION: 1. No acute intracranial abnormality. 2. Moderate chronic small vessel ischemic changes. 3. Normal intracranial MRA. Electronically Signed   By: Deatra Robinson M.D.   On: 12/14/2020 00:15   DG Chest Port 1 View  Result Date: 12/13/2020 CLINICAL DATA:  Weakness EXAM: PORTABLE CHEST 1 VIEW COMPARISON:  None. FINDINGS: The heart size and mediastinal contours are within normal limits. Both lungs are clear. The visualized skeletal structures are unremarkable. IMPRESSION: No active disease. Electronically Signed   By: Alcide Clever M.D.   On: 12/13/2020 18:57   EEG adult  Result Date: 12/14/2020 Charlsie Quest, MD     12/14/2020  5:32 PM Patient Name: Mariavictoria Nottingham MRN: [id number] Epilepsy Attending: Charlsie Quest Referring Physician/Provider: Shon Hale, NP Date: 12/14/2020 Duration: 29.36 mins Patient  history: 70 year old female with left upper extremity weakness.  EEG to evaluate for seizures. Level of alertness: Awake AEDs during EEG study: None Technical aspects: This EEG study was done with scalp electrodes positioned according to the 10-20 International system of electrode placement. Electrical activity was acquired at a sampling rate of 500Hz  and reviewed with a high frequency filter of 70Hz  and a low frequency filter of 1Hz . EEG data were recorded continuously and digitally stored. Description: The posterior dominant rhythm consists of 8Hz  activity of moderate voltage (25-35 uV) seen predominantly in posterior head regions, symmetric and reactive to eye opening and eye closing. EEG showed intermittent 2- 3 Hz delta slowing in right temporal region. Hyperventilation and photic stimulation were not performed.   ABNORMALITY - Intermittent slow, right temporal region IMPRESSION: This study is suggestive of nonspecific cortical dysfunction in right temporal region.  No seizures or epileptiform discharges were seen throughout the recording. If suspicion for interictal activity remains a concern, a prolonged study can be considered.   ECHOCARDIOGRAM COMPLETE  Result Date: 12/14/2020    ECHOCARDIOGRAM REPORT   Patient Name:   MARQUETTE PIONTEK Date of Exam: 12/14/2020 Medical Rec #:  Charlsie Quest  Height: Accession #:    [id number]     Weight:       153.9 lb Date of Birth:  1950/07/20       BSA:          1.696 m Patient Age:    70 years       BP:           171/91 mmHg Patient Gender: F              HR:           76 bpm. Exam Location:  Inpatient Procedure: 2D Echo, Cardiac Doppler and Color Doppler Indications:    CVA  History:        Patient has no prior history of Echocardiogram examinations.                 TIA, Signs/Symptoms:Altered Mental Status; Risk                 Factors:Hypertension and Dyslipidemia.  Sonographer:    Lavenia Atlas Referring Phys: [phone] DANIELLE RAY  Sonographer  Comments: Technically difficult study due to poor echo windows. Image acquisition challenging due to uncooperative patient and AMS. IMPRESSIONS  1. Left ventricular ejection fraction, by estimation, is 55 to 60%. The left ventricle has normal function. The left ventricle has no regional wall motion abnormalities. Left ventricular diastolic parameters are consistent with Grade I diastolic dysfunction (impaired relaxation).  2. Right ventricular systolic function is mildly reduced. The right ventricular size is normal. There is normal pulmonary artery systolic pressure.  3. The pericardial effusion is localized near the right ventricle.  4. The mitral valve is grossly normal. Trivial mitral valve regurgitation.  5. The aortic valve is tricuspid. Aortic valve regurgitation is trivial. Mild aortic valve sclerosis is present, with no evidence of aortic valve stenosis.  6. The inferior vena cava is normal in size with greater than 50% respiratory variability, suggesting right atrial pressure of 3 mmHg. Comparison(s): No prior Echocardiogram. FINDINGS  Left Ventricle: Left ventricular ejection fraction, by estimation, is 55 to 60%. The left ventricle has normal function. The left ventricle has no regional wall motion abnormalities. The left ventricular internal cavity size was normal in size. There is  no left ventricular hypertrophy. Left ventricular diastolic parameters are consistent with Grade I diastolic dysfunction (impaired relaxation). Indeterminate filling pressures. Right Ventricle: The right ventricular size is normal. No increase in right ventricular wall thickness. Right ventricular systolic function is mildly reduced. There is normal pulmonary artery systolic pressure. The tricuspid regurgitant velocity is 1.92 m/s, and with an assumed right atrial pressure of 3 mmHg, the estimated right ventricular systolic pressure is 17.7 mmHg. Left Atrium: Left atrial size was normal in size. Right Atrium: Right atrial  size was normal in size. Pericardium: Trivial pericardial effusion is present. The pericardial effusion is localized near the right ventricle. Mitral Valve: The mitral valve is grossly normal. Trivial mitral valve regurgitation. Tricuspid Valve: The tricuspid valve is grossly normal. Tricuspid valve regurgitation is trivial. Aortic Valve: The aortic valve is tricuspid. Aortic valve regurgitation is trivial. Aortic regurgitation PHT measures 1263 msec. Mild aortic valve sclerosis is present, with no evidence of aortic valve stenosis. Pulmonic Valve: The pulmonic valve was normal in structure. Pulmonic valve regurgitation is not visualized. Aorta: The aortic root and ascending aorta are structurally normal, with no evidence of dilitation. Venous: The inferior vena cava is normal in size with greater than 50% respiratory variability, suggesting right atrial pressure of  3 mmHg. IAS/Shunts: The interatrial septum was not well visualized.  LEFT VENTRICLE PLAX 2D LVIDd:         3.20 cm  Diastology LVIDs:         2.20 cm  LV e' medial:    4.03 cm/s LV PW:         0.90 cm  LV E/e' medial:  14.1 LV IVS:        0.70 cm  LV e' lateral:   5.22 cm/s LVOT diam:     1.80 cm  LV E/e' lateral: 10.9 LV SV:         53 LV SV Index:   31 LVOT Area:     2.54 cm  RIGHT VENTRICLE RV Basal diam:  2.10 cm RV S prime:     8.05 cm/s TAPSE (M-mode): 1.8 cm LEFT ATRIUM             Index       RIGHT ATRIUM           Index LA diam:        2.60 cm 1.53 cm/m  RA Area:     12.40 cm LA Vol (A2C):   33.7 ml 19.88 ml/m RA Volume:   29.90 ml  17.63 ml/m LA Vol (A4C):   32.9 ml 19.40 ml/m LA Biplane Vol: 33.6 ml 19.82 ml/m  AORTIC VALVE LVOT Vmax:   105.00 cm/s LVOT Vmean:  74.600 cm/s LVOT VTI:    0.209 m AI PHT:      1263 msec  AORTA Ao Root diam: 2.30 cm MITRAL VALVE               TRICUSPID VALVE MV Area (PHT): 3.12 cm    TR Peak grad:   14.7 mmHg MV Decel Time: 243 msec    TR Vmax:        192.00 cm/s MV E velocity: 57.00 cm/s MV A velocity:  88.30 cm/s  SHUNTS MV E/A ratio:  0.65        Systemic VTI:  0.21 m                            Systemic Diam: 1.80 cm Zoila Shutter MD Electronically signed by Zoila Shutter MD Signature Date/Time: 12/14/2020/1:17:58 PM    Final    CT HEAD CODE STROKE WO CONTRAST  Result Date: 12/13/2020 CLINICAL DATA:  Code stroke. Neuro deficit, acute, stroke suspected. Additional history provided: Left-sided facial droop, left-sided weakness. EXAM: CT HEAD WITHOUT CONTRAST TECHNIQUE: Contiguous axial images were obtained from the base of the skull through the vertex without intravenous contrast. COMPARISON:  No pertinent prior exams available for comparison. FINDINGS: Brain: Moderate cerebral and cerebellar atrophy. Moderate/advanced patchy and confluent hypoattenuation within the cerebral white matter is nonspecific, but compatible with chronic small vessel ischemic disease. Chronic appearing lacunar infarct within the right cerebellar hemisphere. There is no acute intracranial hemorrhage. No demarcated cortical infarct. No extra-axial fluid collection. No evidence of intracranial mass. No midline shift. Vascular: No hyperdense vessel.  Atherosclerotic calcifications. Skull: Normal. Negative for fracture or focal lesion. Sinuses/Orbits: Visualized orbits show no acute finding. No significant paranasal sinus disease at the imaged levels. Other: Left mastoid effusion ASPECTS ([place]ta Stroke Program Early CT Score) - Ganglionic level infarction (caudate, lentiform nuclei, internal capsule, insula, M1-M3 cortex): 7 - Supraganglionic infarction (M4-M6 cortex): 3 Total score (0-10 with 10 being normal): 10 These results were communicated to Dr. Otelia Limes at  5:38 pmon 6/19/2022by text page via the [facility] messaging system. IMPRESSION: No evidence of acute intracranial abnormality.  ASPECTS is 10. Moderate generalized parenchymal atrophy. Moderate/advanced cerebral white matter chronic small vessel ischemic disease. Chronic appearing  lacunar infarct within the right cerebellar hemisphere. Left mastoid effusion. Electronically Signed   By: Jackey Loge DO   On: 12/13/2020 17:38   VAS US CAROTID  Result Date: 12/14/2020 Carotid Arterial Duplex Study Patient Name:  MALON SIDDALL  Date of Exam:   12/14/2020 Medical Rec #: [id number]       Accession #:    [id number] Date of Birth: [birth year]        Patient Gender: F Patient Age:   70Y Exam Location:  [facility] Procedure:      VAS US CAROTID Referring Phys: [id number] DANIELLE RAY --------------------------------------------------------------------------------  Indications:       CVA. Comparison Study:  No prior studies. Performing Technologist: Jean Rosenthal RDMS,RVT  Examination Guidelines: A complete evaluation includes B-mode imaging, spectral Doppler, color Doppler, and power Doppler as needed of all accessible portions of each vessel. Bilateral testing is considered an integral part of a complete examination. Limited examinations for reoccurring indications may be performed as noted.  Right Carotid Findings: +----------+--------+--------+--------+------------------+------------------+           PSV cm/sEDV cm/sStenosisPlaque DescriptionComments           +----------+--------+--------+--------+------------------+------------------+ CCA Prox  101     11                                                   +----------+--------+--------+--------+------------------+------------------+ CCA Distal59      10                                intimal thickening +----------+--------+--------+--------+------------------+------------------+ ICA Prox  53      14      1-39%   heterogenous                         +----------+--------+--------+--------+------------------+------------------+ ICA Distal87      27                                                   +----------+--------+--------+--------+------------------+------------------+ ECA       114                                                           +----------+--------+--------+--------+------------------+------------------+ +----------+--------+-------+----------------+-------------------+           PSV cm/sEDV cmsDescribe        Arm Pressure (mmHG) +----------+--------+-------+----------------+-------------------+ [id number]            Multiphasic, WNL                    +----------+--------+-------+----------------+-------------------+ +---------+--------+--+--------+--+---------+ VertebralPSV cm/s43EDV cm/s12Antegrade +---------+--------+--+--------+--+---------+  Left Carotid Findings: +----------+--------+--------+--------+------------------+------------------+           PSV cm/sEDV cm/sStenosisPlaque DescriptionComments           +----------+--------+--------+--------+------------------+------------------+  CCA Prox  108     11                                                   +----------+--------+--------+--------+------------------+------------------+ CCA Distal80      14                                intimal thickening +----------+--------+--------+--------+------------------+------------------+ ICA Prox  67      21      1-39%   heterogenous                         +----------+--------+--------+--------+------------------+------------------+ ICA Distal100     29                                                   +----------+--------+--------+--------+------------------+------------------+ ECA       104                                                          +----------+--------+--------+--------+------------------+------------------+ +----------+--------+--------+----------------+-------------------+           PSV cm/sEDV cm/sDescribe        Arm Pressure (mmHG) +----------+--------+--------+----------------+-------------------+ [id number]             Multiphasic, WNL                     +----------+--------+--------+----------------+-------------------+ +---------+--------+--+--------+--+---------+ VertebralPSV cm/s77EDV cm/s15Antegrade +---------+--------+--+--------+--+---------+   Summary: Right Carotid: Velocities in the right ICA are consistent with a 1-39% stenosis. Left Carotid: Velocities in the left ICA are consistent with a 1-39% stenosis. Vertebrals:  Bilateral vertebral arteries demonstrate antegrade flow. Subclavians: Normal flow hemodynamics were seen in bilateral subclavian              arteries. *See table(s) above for measurements and observations.  Electronically signed by Delia Heady MD on 12/14/2020 at 3:10:36 PM.    Final      Discharge Instructions: Ms. Freet, It was a pleasure taking care of you at [facility]. You were admitted for weakness and found to have a mini stroke. We are discharging you home now that you are doing better. Please follow the following instructions.  1) Start takin Aspirin 81 mg daily 2) Take Plavix 75 mg daily for 3 weeks then STOP 3) Take rosuvastatin 20 mg daily 4) Make a 1-week hospital follow-up appointment with your primary care doctor  Take care,  Dr. Sharrell Ku, MD, MPH  Signed: Steffanie Rainwater, MD 12/15/2020, 1:30 PM   Pager: [phone]

## 2020-12-15 NOTE — Progress Notes (Signed)
Spoke with assigned nurse Nehemiah Settle, LPN at assisted living, report given and informed Nehemiah Settle, LPN hast patient has been looking for her glasses that she was not admitted with. Brooke, LPN verbalized understanding.

## 2020-12-15 NOTE — [place] FL2 (Signed)
  [place] MEDICAID FL2 LEVEL OF CARE SCREENING TOOL     IDENTIFICATION  Patient Name: Denise Riley Birthdate: [birth year] Sex: female Admission Date (Current Location): 12/13/2020  [facility] and [place] Number:  [employer] and Address:  [facility], [address] [place], [place] [address]      Provider Number: [id number]  Attending Physician Name and Address:  Gust Rung, DO  Relative Name and Phone Number:       Current Level of Care: Hospital Recommended Level of Care: Skilled Nursing Facility Prior Approval Number:    Date Approved/Denied:   PASRR Number:    Discharge Plan: SNF    Current Diagnoses: Patient Active Problem List   Diagnosis Date Noted   TIA (transient ischemic attack) 12/13/2020    Orientation RESPIRATION BLADDER Height & Weight     Self, Time, Place  Normal Incontinent Weight: 153 lb 14.1 oz (69.8 kg) Height:     BEHAVIORAL SYMPTOMS/MOOD NEUROLOGICAL BOWEL NUTRITION STATUS      Continent Diet (see DC summary)  AMBULATORY STATUS COMMUNICATION OF NEEDS Skin   Limited Assist Verbally Normal                       Personal Care Assistance Level of Assistance  Bathing, Feeding, Dressing Bathing Assistance: Limited assistance Feeding assistance: Limited assistance Dressing Assistance: Limited assistance     Functional Limitations Info             SPECIAL CARE FACTORS FREQUENCY                       Contractures Contractures Info: Not present    Additional Factors Info  Code Status, Allergies, Psychotropic Code Status Info: Full Allergies Info: Erythromycin, Penicillins Psychotropic Info: Celexa 20mg  daily, Risperdal 2mg  daily at bed         Current Medications (12/15/2020):  This is the current hospital active medication list Current Facility-Administered Medications  Medication Dose Route Frequency Provider Last Rate Last Admin   acetaminophen (TYLENOL) tablet 650 mg  650 mg  Oral Q6H PRN , MD       Or   acetaminophen (TYLENOL) suppository 650 mg  650 mg Rectal Q6H PRN 12/17/2020, MD       aspirin EC tablet 81 mg  81 mg Oral Daily Dellia Cloud, MD   81 mg at 12/15/20 0912   citalopram (CELEXA) tablet 20 mg  20 mg Oral Daily Dellia Cloud, MD   20 mg at 12/15/20 0912   clopidogrel (PLAVIX) tablet 75 mg  75 mg Oral Daily Dellia Cloud, MD   75 mg at 12/15/20 0912   enoxaparin (LOVENOX) injection 40 mg  40 mg Subcutaneous Q24H Micki Riley, MD   40 mg at 12/14/20 1955   risperiDONE (RISPERDAL) tablet 2 mg  2 mg Oral QHS Dellia Cloud, MD   2 mg at 12/14/20 1955   rosuvastatin (CRESTOR) tablet 10 mg  10 mg Oral q morning Dellia Cloud, MD   10 mg at 12/15/20 0915   sodium chloride flush (NS) 0.9 % injection 3 mL  3 mL Intravenous Q12H Steffanie Rainwater, MD   3 mL at 12/14/20 1958     Discharge Medications: Please see discharge summary for a list of discharge medications.  Relevant Imaging Results:  Relevant Lab Results:   Additional Information SS#: Dellia Cloud  12/16/20, LCSW

## 2020-12-15 NOTE — Plan of Care (Signed)

## 2020-12-15 NOTE — Evaluation (Signed)
Occupational Therapy Evaluation Patient Details Name: Denise Riley MRN: [id number] DOB: 1951-05-26 Today's Date: 12/15/2020    History of Present Illness 70 y.o. female presenting to [facility] from [facility] with left arm weakness on 6/19. MRI negative. PMH of vascular dementia, paranoid personality disorder, HTN, HLD, vitamin D deficiency, anemia, and major depressive disorder   Clinical Impression   PTA, pt was at [facility] SN[facility] and required assistance for ADLs; using RW for mobility. Pt currently requiring Min A for ADLs and Min Guard-Min A for functional mobility with RW. Pt presenting with decreased balance and cognition impacting her functional performance and safety. Pt benefiting from simple, direct cues and quite environment. Pt would benefit from further acute OT to facilitate safe dc. Recommend dc to SNF for further OT to optimize safety, independence with ADLs, and return to PLOF.     Follow Up Recommendations  SNF    Equipment Recommendations  None recommended by OT    Recommendations for Other Services PT consult     Precautions / Restrictions Precautions Precautions: Fall      Mobility Bed Mobility Overal bed mobility: Needs Assistance Bed Mobility: Supine to Sit;Sit to Supine     Supine to sit: Min guard;HOB elevated Sit to supine: Min guard   General bed mobility comments: Increased time and effort    Transfers Overall transfer level: Needs assistance Equipment used: Rolling walker (2 wheeled) Transfers: Sit to/from Stand Sit to Stand: Min guard;Min assist         General transfer comment: Min Guard A from EOB and Min A from lower toilet    Balance Overall balance assessment: Needs assistance Sitting-balance support: No upper extremity supported;Feet supported Sitting balance-Leahy Scale: Good     Standing balance support: Bilateral upper extremity supported;During functional activity Standing balance-Leahy Scale: Poor Standing  balance comment: reliant on UE support                           ADL either performed or assessed with clinical judgement   ADL Overall ADL's : Needs assistance/impaired Eating/Feeding: Set up;Sitting   Grooming: Wash/dry hands;Wash/dry face;Min guard;Standing Grooming Details (indicate cue type and reason): Pt performing hand hygiene at sink with MIn Guard A. Pt then looking at herself in the mirror and then washing her face. Upper Body Bathing: Minimal assistance;Sitting   Lower Body Bathing: Minimal assistance;Sit to/from stand   Upper Body Dressing : Minimal assistance;Sitting   Lower Body Dressing: Minimal assistance;Sit to/from stand   Toilet Transfer: Minimal assistance;Ambulation;Regular Toilet;Grab bars;RW Statistician Details (indicate cue type and reason): Min A for safe descent and then power up into standing. Difficulty with weight shift forward during sit>stand Toileting- Clothing Manipulation and Hygiene: Minimal assistance;Sit to/from stand Toileting - Clothing Manipulation Details (indicate cue type and reason): Min A for [employer] gown     Functional mobility during ADLs: Min guard;Rolling walker;Cueing for safety General ADL Comments: Pt with decreased cognition and balance     Vision Baseline Vision/History: Wears glasses Wears Glasses: At all times Patient Visual Report: Other (comment) (glasses not in room)       Perception     Praxis      Pertinent Vitals/Pain Pain Assessment: Faces Faces Pain Scale: No hurt Pain Intervention(s): Monitored during session     Hand Dominance Right   Extremity/Trunk Assessment Upper Extremity Assessment Upper Extremity Assessment: Generalized weakness;RUE deficits/detail RUE Deficits / Details: During strength testing, RUE grasp and general gross  strength weaker compared to LUE.   Lower Extremity Assessment Lower Extremity Assessment: Defer to PT evaluation   Cervical / Trunk Assessment Cervical  / Trunk Assessment: Normal   Communication Communication Communication: No difficulties   Cognition Arousal/Alertness: Awake/alert Behavior During Therapy: Flat affect Overall Cognitive Status: No family/caregiver present to determine baseline cognitive functioning                                 General Comments: Dementia at baseline. Follows simple commands consistently. Minimal verbalizations. Requiring increased time for processing.   General Comments  Reporting feeling light headed. HR 80-100s. BP 151/79 (97) supine    Exercises     Shoulder Instructions      Home Living Family/patient expects to be discharged to:: Skilled nursing facility                                 Additional Comments: [facility]      Prior Functioning/Environment Level of Independence: Needs assistance  Gait / Transfers Assistance Needed: amb with/without RW, frequent falls ADL's / Homemaking Assistance Needed: staff assists with all ADLs            OT Problem List: Decreased range of motion;Decreased activity tolerance;Impaired balance (sitting and/or standing);Decreased cognition;Decreased safety awareness;Decreased knowledge of use of DME or AE;Decreased knowledge of precautions      OT Treatment/Interventions: Self-care/ADL training;Therapeutic exercise;Energy conservation;Therapeutic activities;Patient/family education    OT Goals(Current goals can be found in the care plan section) Acute Rehab OT Goals Patient Stated Goal: Not stated OT Goal Formulation: With patient Time For Goal Achievement: 12/29/20 Potential to Achieve Goals: Good  OT Frequency: Min 2X/week   Barriers to D/C:            Co-evaluation              AM-PAC OT "6 Clicks" Daily Activity     Outcome Measure Help from another person eating meals?: None Help from another person taking care of personal grooming?: A Little Help from another person toileting,  which includes using toliet, bedpan, or urinal?: A Little Help from another person bathing (including washing, rinsing, drying)?: A Little Help from another person to put on and taking off regular upper body clothing?: A Little Help from another person to put on and taking off regular lower body clothing?: A Little 6 Click Score: 19   End of Session Equipment Utilized During Treatment: Gait belt;Rolling walker Nurse Communication: Mobility status  Activity Tolerance: Patient tolerated treatment well Patient left: in bed;with call bell/phone within reach;with bed alarm set;with restraints reapplied  OT Visit Diagnosis: Unsteadiness on feet (R26.81);Other abnormalities of gait and mobility (R26.89);Muscle weakness (generalized) (M62.81)                Time: 5102-5852 OT Time Calculation (min): 27 min Charges:  OT General Charges $OT Visit: 1 Visit OT Evaluation $OT Eval Moderate Complexity: 1 Mod OT Treatments $Self Care/Home Management : 8-22 mins  Joab Carden MSOT, OTR/L Acute Rehab Pager: [phone] Office: [phone]  Theodoro Grist Glenette Bookwalter 12/15/2020, 2:32 PM

## 2020-12-15 NOTE — Discharge Instructions (Signed)
Denise Riley, It was a pleasure taking care of you at [facility] You were admitted for weakness and found to have a mini stroke. We are discharging you home now that you are doing better. Please follow the following instructions.  1) Start takin Aspirin 81 mg daily 2) Take Plavix 75 mg daily for 3 weeks then STOP 3) Take rosuvastatin 20 mg daily 4) Make a 1-week hospital follow-up appointment with your primary care doctor  Take care,  Dr. Sharrell Ku, MD, MPH

## 2020-12-15 NOTE — Evaluation (Addendum)
Physical Therapy Evaluation Patient Details Name: Denise Riley MRN: [id number] DOB: 1950/10/30 Today's Date: 12/15/2020   History of Present Illness  Pt is a 70 y.o. female admitter 6/19 with diagnosis of TIA. She presented to [facility] from [facility] with left arm weakness.MRI negative. PMH of vascular dementia, paranoid personality disorder, HTN, HLD, vitamin D deficiency, anemia, and major depressive disorder,   Clinical Impression  Pt admitted with above diagnosis. On eval, pt required min assist bed mobility, min guard assist transfers, and min guard assist ambulation 55' with RW. Generalized weakness BLE, but appears symmetrical. Pt currently with functional limitations due to the deficits listed below (see PT Problem List). Pt will benefit from skilled PT to increase their independence and safety with mobility to allow discharge to the venue listed below.       Follow Up Recommendations SNF    Equipment Recommendations  None recommended by PT    Recommendations for Other Services       Precautions / Restrictions Precautions Precautions: Fall Restrictions Weight Bearing Restrictions: No      Mobility  Bed Mobility Overal bed mobility: Needs Assistance Bed Mobility: Supine to Sit;Sit to Supine     Supine to sit: Min assist;HOB elevated Sit to supine: Min assist   General bed mobility comments: increased time    Transfers Overall transfer level: Needs assistance Equipment used: Rolling walker (2 wheeled) Transfers: Sit to/from Stand Sit to Stand: Min guard         General transfer comment: cues for hand placement, pt able to power up without physical assist, min guard for safety, increased time  Ambulation/Gait Ambulation/Gait assistance: Min guard Gait Distance (Feet): 70 Feet Assistive device: Rolling walker (2 wheeled) Gait Pattern/deviations: Step-through pattern;Decreased stride length Gait velocity: decreased Gait velocity  interpretation: <1.31 ft/sec, indicative of household ambulator General Gait Details: Mild shaking noted after amb ~ 35'. Unsure if related to fatigue. Pt unreliable for relaying how she is feeling. Returned to room for safety.  Stairs            Wheelchair Mobility    Modified Rankin (Stroke Patients Only)       Balance Overall balance assessment: Needs assistance Sitting-balance support: Feet supported;No upper extremity supported Sitting balance-Leahy Scale: Good     Standing balance support: Bilateral upper extremity supported;During functional activity Standing balance-Leahy Scale: Poor Standing balance comment: reliant on external support                             Pertinent Vitals/Pain Pain Assessment: Faces Faces Pain Scale: No hurt    Home Living Family/patient expects to be discharged to:: Skilled nursing facility                      Prior Function Level of Independence: Needs assistance   Gait / Transfers Assistance Needed: amb with/without RW, frequent falls  ADL's / Homemaking Assistance Needed: staff assists with all ADLs        Hand Dominance        Extremity/Trunk Assessment   Upper Extremity Assessment Upper Extremity Assessment: Defer to OT evaluation    Lower Extremity Assessment Lower Extremity Assessment: Generalized weakness    Cervical / Trunk Assessment Cervical / Trunk Assessment: Normal  Communication   Communication: No difficulties  Cognition Arousal/Alertness: Awake/alert Behavior During Therapy: Flat affect Overall Cognitive Status: No family/caregiver present to determine baseline cognitive functioning  General Comments: Dementia at baseline. Follows simple commands consistently. Cooperative. Minimal verbalizations. Concerned about where her glasses are.      General Comments      Exercises     Assessment/Plan    PT Assessment Patient needs  continued PT services  PT Problem List Decreased mobility;Decreased strength;Decreased activity tolerance;Decreased balance       PT Treatment Interventions Therapeutic activities;Gait training;Therapeutic exercise;Patient/family education;Balance training;Functional mobility training    PT Goals (Current goals can be found in the Care Plan section)  Acute Rehab PT Goals Patient Stated Goal: not stated PT Goal Formulation: Patient unable to participate in goal setting Time For Goal Achievement: 12/29/20 Potential to Achieve Goals: Fair    Frequency Min 3X/week   Barriers to discharge        Co-evaluation               AM-PAC PT "6 Clicks" Mobility  Outcome Measure Help needed turning from your back to your side while in a flat bed without using bedrails?: A Little Help needed moving from lying on your back to sitting on the side of a flat bed without using bedrails?: A Little Help needed moving to and from a bed to a chair (including a wheelchair)?: A Little Help needed standing up from a chair using your arms (e.g., wheelchair or bedside chair)?: A Little Help needed to walk in hospital room?: A Little Help needed climbing 3-5 steps with a railing? : A Lot 6 Click Score: 17    End of Session Equipment Utilized During Treatment: Gait belt Activity Tolerance: Patient tolerated treatment well Patient left: in bed;with call bell/phone within reach;with bed alarm set;with restraints reapplied Nurse Communication: Mobility status PT Visit Diagnosis: Unsteadiness on feet (R26.81);Muscle weakness (generalized) (M62.81)    Time: 1610-9604 PT Time Calculation (min) (ACUTE ONLY): 23 min   Charges:   PT Evaluation $PT Eval Moderate Complexity: 1 Mod          Aida Raider, PT  Office # [phone] Pager [phone]   Ilda Foil 12/15/2020, 10:13 AM

## 2020-12-15 NOTE — TOC Transition Note (Signed)
Transition of Care [facility]) - CM/SW Discharge Note   Patient Details  Name: Denise Riley MRN: [id number] Date of Birth: July 21, 1950  [facility] CM/SW Contact:  Baldemar Lenis, LCSW Phone Number: 12/15/2020, 3:39 PM   Clinical Narrative:   CSW spoke with daughter, Ander Slade, earlier today to confirm plan to return to [facility] when stable. CSW updated [facility] that patient will be returning today, sent discharge summary. They are ready for patient to return.  Nurse to call report to [phone], Room 616A    Final next level of care: Skilled Nursing Facility Barriers to Discharge: Barriers Resolved   Patient Goals and CMS Choice Patient states their goals for this hospitalization and ongoing recovery are:: patient unable to participate in goal setting, not oriented CMS Medicare.gov Compare Post Acute Care list provided to:: Patient Represenative (must comment) Choice offered to / list presented to : Adult Children  Discharge Placement              Patient chooses bed at:  [facility]) Patient to be transferred to facility by: [facility] Name of family member notified: Joy Patient and family notified of of transfer: 12/15/20  Discharge Plan and Services                                     Social Determinants of Health (SDOH) Interventions     Readmission Risk Interventions No flowsheet data found.

## 2020-12-15 NOTE — Progress Notes (Signed)
Soft waist restraints discontinued and patient will be discharged. All safety measures maintained at this time.

## 2020-12-17 LAB — VITAMIN B1: Vitamin B1 (Thiamine): 109.7 nmol/L (ref 66.5–200.0)
